# Patient Record
Sex: Female | Born: 1945 | Race: White | Hispanic: No | Smoking: Former smoker
Health system: Southern US, Community
[De-identification: ages and names within clinical notes are randomized; demographics above are authoritative.]

## PROBLEM LIST (undated history)

## (undated) DIAGNOSIS — S92909A Unspecified fracture of unspecified foot, initial encounter for closed fracture: Secondary | ICD-10-CM

## (undated) DIAGNOSIS — Z860100 Personal history of colon polyps, unspecified: Secondary | ICD-10-CM

## (undated) DIAGNOSIS — I619 Nontraumatic intracerebral hemorrhage, unspecified: Secondary | ICD-10-CM

## (undated) DIAGNOSIS — F329 Major depressive disorder, single episode, unspecified: Secondary | ICD-10-CM

## (undated) DIAGNOSIS — C801 Malignant (primary) neoplasm, unspecified: Secondary | ICD-10-CM

## (undated) DIAGNOSIS — M810 Age-related osteoporosis without current pathological fracture: Secondary | ICD-10-CM

## (undated) DIAGNOSIS — R569 Unspecified convulsions: Secondary | ICD-10-CM

## (undated) DIAGNOSIS — E785 Hyperlipidemia, unspecified: Secondary | ICD-10-CM

## (undated) DIAGNOSIS — R918 Other nonspecific abnormal finding of lung field: Secondary | ICD-10-CM

## (undated) DIAGNOSIS — G40802 Other epilepsy, not intractable, without status epilepticus: Principal | ICD-10-CM

## (undated) DIAGNOSIS — H9190 Unspecified hearing loss, unspecified ear: Secondary | ICD-10-CM

## (undated) DIAGNOSIS — F32A Depression, unspecified: Secondary | ICD-10-CM

## (undated) DIAGNOSIS — N951 Menopausal and female climacteric states: Secondary | ICD-10-CM

## (undated) DIAGNOSIS — Q2730 Arteriovenous malformation, site unspecified: Secondary | ICD-10-CM

## (undated) DIAGNOSIS — G4710 Hypersomnia, unspecified: Secondary | ICD-10-CM

## (undated) DIAGNOSIS — H5509 Other forms of nystagmus: Secondary | ICD-10-CM

## (undated) HISTORY — PX: EYE SURGERY: SHX253

## (undated) HISTORY — DX: Hypersomnia, unspecified: G47.10

## (undated) HISTORY — DX: Hyperlipidemia, unspecified: E78.5

## (undated) HISTORY — DX: Unspecified convulsions: R56.9

## (undated) HISTORY — DX: Other forms of nystagmus: H55.09

## (undated) HISTORY — PX: ABDOMINAL HYSTERECTOMY: SHX81

## (undated) HISTORY — DX: Arteriovenous malformation, site unspecified: Q27.30

## (undated) HISTORY — PX: OTHER SURGICAL HISTORY: SHX169

## (undated) HISTORY — DX: Other epilepsy, not intractable, without status epilepticus: G40.802

---

## 1973-11-29 DIAGNOSIS — Q2730 Arteriovenous malformation, site unspecified: Secondary | ICD-10-CM

## 1973-11-29 HISTORY — DX: Arteriovenous malformation, site unspecified: Q27.30

## 2002-03-31 HISTORY — PX: BREAST BIOPSY: SHX20

## 2004-04-17 ENCOUNTER — Ambulatory Visit: Payer: Self-pay | Admitting: Internal Medicine

## 2004-08-22 ENCOUNTER — Ambulatory Visit: Payer: Self-pay | Admitting: Internal Medicine

## 2005-02-26 ENCOUNTER — Emergency Department: Payer: Self-pay | Admitting: Emergency Medicine

## 2005-03-31 HISTORY — PX: SHOULDER SURGERY: SHX246

## 2005-04-01 ENCOUNTER — Ambulatory Visit: Payer: Self-pay | Admitting: Family Medicine

## 2005-04-24 ENCOUNTER — Ambulatory Visit: Payer: Self-pay | Admitting: Internal Medicine

## 2005-08-13 ENCOUNTER — Ambulatory Visit: Payer: Self-pay | Admitting: Internal Medicine

## 2005-10-14 ENCOUNTER — Ambulatory Visit: Payer: Self-pay | Admitting: Psychiatry

## 2006-04-28 ENCOUNTER — Ambulatory Visit: Payer: Self-pay | Admitting: Internal Medicine

## 2007-05-03 ENCOUNTER — Ambulatory Visit: Payer: Self-pay | Admitting: Internal Medicine

## 2008-05-03 ENCOUNTER — Ambulatory Visit: Payer: Self-pay | Admitting: Internal Medicine

## 2009-05-08 ENCOUNTER — Ambulatory Visit: Payer: Self-pay | Admitting: Internal Medicine

## 2009-12-17 ENCOUNTER — Ambulatory Visit (HOSPITAL_COMMUNITY): Admission: RE | Admit: 2009-12-17 | Discharge: 2009-12-17 | Payer: Self-pay | Admitting: Neurology

## 2010-05-13 ENCOUNTER — Ambulatory Visit: Payer: Self-pay | Admitting: Internal Medicine

## 2010-06-13 LAB — CBC
HCT: 48.8 % — ABNORMAL HIGH (ref 36.0–46.0)
Hemoglobin: 16.8 g/dL — ABNORMAL HIGH (ref 12.0–15.0)
MCH: 32.7 pg (ref 26.0–34.0)
MCHC: 34.4 g/dL (ref 30.0–36.0)
MCV: 95.1 fL (ref 78.0–100.0)
Platelets: 199 K/uL (ref 150–400)
RBC: 5.13 MIL/uL — ABNORMAL HIGH (ref 3.87–5.11)
RDW: 13.4 % (ref 11.5–15.5)
WBC: 8.8 K/uL (ref 4.0–10.5)

## 2010-06-13 LAB — BASIC METABOLIC PANEL WITH GFR
BUN: 11 mg/dL (ref 6–23)
CO2: 28 meq/L (ref 19–32)
Calcium: 9.2 mg/dL (ref 8.4–10.5)
Chloride: 108 meq/L (ref 96–112)
Creatinine, Ser: 0.63 mg/dL (ref 0.4–1.2)
GFR calc non Af Amer: >60 mL/min
Glucose, Bld: 92 mg/dL (ref 70–99)
Potassium: 3.9 meq/L (ref 3.5–5.1)
Sodium: 141 meq/L (ref 135–145)

## 2010-06-13 LAB — PROTIME-INR
INR: 0.88 (ref 0.00–1.49)
Prothrombin Time: 12.1 seconds (ref 11.6–15.2)

## 2010-11-30 ENCOUNTER — Ambulatory Visit: Payer: Self-pay | Admitting: Cardiothoracic Surgery

## 2010-12-09 ENCOUNTER — Ambulatory Visit: Payer: Self-pay | Admitting: Internal Medicine

## 2010-12-18 ENCOUNTER — Ambulatory Visit: Payer: Self-pay | Admitting: Specialist

## 2010-12-26 ENCOUNTER — Ambulatory Visit: Payer: Self-pay | Admitting: Cardiothoracic Surgery

## 2010-12-30 ENCOUNTER — Ambulatory Visit: Payer: Self-pay | Admitting: Cardiothoracic Surgery

## 2011-04-10 ENCOUNTER — Ambulatory Visit: Payer: Self-pay | Admitting: Cardiothoracic Surgery

## 2011-05-02 ENCOUNTER — Ambulatory Visit: Payer: Self-pay | Admitting: Cardiothoracic Surgery

## 2011-05-22 ENCOUNTER — Ambulatory Visit: Payer: Self-pay | Admitting: Internal Medicine

## 2011-08-12 ENCOUNTER — Ambulatory Visit: Payer: Self-pay | Admitting: Cardiothoracic Surgery

## 2011-08-14 ENCOUNTER — Ambulatory Visit: Payer: Self-pay | Admitting: Cardiothoracic Surgery

## 2011-08-30 ENCOUNTER — Ambulatory Visit: Payer: Self-pay | Admitting: Cardiothoracic Surgery

## 2012-02-12 ENCOUNTER — Ambulatory Visit: Payer: Self-pay | Admitting: Cardiothoracic Surgery

## 2012-02-29 ENCOUNTER — Ambulatory Visit: Payer: Self-pay | Admitting: Cardiothoracic Surgery

## 2012-03-04 ENCOUNTER — Ambulatory Visit: Payer: Self-pay | Admitting: Unknown Physician Specialty

## 2012-05-01 ENCOUNTER — Ambulatory Visit: Payer: Self-pay | Admitting: Cardiothoracic Surgery

## 2012-05-10 ENCOUNTER — Encounter: Payer: Self-pay | Admitting: *Deleted

## 2012-05-10 ENCOUNTER — Encounter: Payer: Self-pay | Admitting: Neurology

## 2012-05-10 DIAGNOSIS — G4730 Sleep apnea, unspecified: Secondary | ICD-10-CM | POA: Insufficient documentation

## 2012-05-10 DIAGNOSIS — H532 Diplopia: Secondary | ICD-10-CM

## 2012-05-10 DIAGNOSIS — Z79899 Other long term (current) drug therapy: Secondary | ICD-10-CM | POA: Insufficient documentation

## 2012-05-10 DIAGNOSIS — G4710 Hypersomnia, unspecified: Secondary | ICD-10-CM | POA: Insufficient documentation

## 2012-05-10 DIAGNOSIS — Q279 Congenital malformation of peripheral vascular system, unspecified: Secondary | ICD-10-CM | POA: Insufficient documentation

## 2012-05-10 DIAGNOSIS — H5509 Other forms of nystagmus: Secondary | ICD-10-CM

## 2012-05-11 ENCOUNTER — Encounter: Payer: Self-pay | Admitting: Neurology

## 2012-05-20 ENCOUNTER — Ambulatory Visit: Payer: Self-pay | Admitting: Cardiothoracic Surgery

## 2012-05-24 ENCOUNTER — Ambulatory Visit: Payer: Self-pay

## 2012-08-27 ENCOUNTER — Telehealth: Payer: Self-pay | Admitting: Neurology

## 2012-08-30 ENCOUNTER — Telehealth: Payer: Self-pay | Admitting: Neurology

## 2012-08-30 DIAGNOSIS — G40109 Localization-related (focal) (partial) symptomatic epilepsy and epileptic syndromes with simple partial seizures, not intractable, without status epilepticus: Secondary | ICD-10-CM

## 2012-08-30 MED ORDER — LAMOTRIGINE 100 MG PO TABS: 100.0000 mg | ORAL_TABLET | Freq: Two times a day (BID) | ORAL | Status: DC

## 2012-08-30 NOTE — Telephone Encounter (Signed)
Lamictal wants generic. Prescription changed.  She has a sensory seizure every  3 weeks or 4.

## 2012-08-30 NOTE — Telephone Encounter (Signed)
I called and spoke with the patient about the reason for her call. Patient stated she has questions for Dr. Vickey Huger concerning the brand name Lamictal compared to the generic (Lamotrigine).

## 2012-11-09 ENCOUNTER — Ambulatory Visit (INDEPENDENT_AMBULATORY_CARE_PROVIDER_SITE_OTHER): Payer: Medicare Other | Admitting: Neurology

## 2012-11-09 ENCOUNTER — Encounter: Payer: Self-pay | Admitting: Neurology

## 2012-11-09 ENCOUNTER — Other Ambulatory Visit: Payer: Self-pay

## 2012-11-09 VITALS — BP 124/69 | HR 75 | Resp 18 | Ht 64.5 in | Wt 123.0 lb

## 2012-11-09 DIAGNOSIS — G40802 Other epilepsy, not intractable, without status epilepticus: Secondary | ICD-10-CM

## 2012-11-09 HISTORY — DX: Other epilepsy, not intractable, without status epilepticus: G40.802

## 2012-11-09 MED ORDER — LAMOTRIGINE 100 MG PO TABS: 100.0000 mg | ORAL_TABLET | Freq: Two times a day (BID) | ORAL | Status: DC

## 2012-11-09 MED ORDER — PHENYTOIN SODIUM EXTENDED 100 MG PO CAPS: 100.0000 mg | ORAL_CAPSULE | ORAL | Status: DC

## 2012-11-09 MED ORDER — PHENYTOIN SODIUM EXTENDED 100 MG PO CAPS: ORAL_CAPSULE | ORAL | Status: DC

## 2012-11-09 MED ORDER — LAMOTRIGINE 100 MG PO TABS: ORAL_TABLET | ORAL | Status: DC

## 2012-11-09 NOTE — Addendum Note (Signed)
Addended by: Melvyn Novas on: 11/09/2012 03:22 PM   Modules accepted: Orders

## 2012-11-09 NOTE — Patient Instructions (Signed)
Seizures You had a seizure. About 2% of the population will have a seizure problem during their lifetime. Sometimes the cause for the seizure is not known. Seizures are usually associated with one of these problems:  Epilepsy.   Not taking your seizure medicine.   Alcohol and drug abuse.   Head injury, strokes, tumors, and brain surgery.   High fever and infections.   Low blood sugar.  Evaluating a new seizure disorder may require having a brain scan or a brain wave test called an EEG. If you have been given a seizure medicine, it is very important that you take it as prescribed. Not taking these medicines as directed is the most common cause of seizures. Blood tests are often used to be sure you are taking the proper dose.  Seizures cause many different symptoms, from convulsions to brief blackouts. Do not ride a bike, drive a car, go swimming, climb in high or dangerous places such as ladders or roofs, or operate any dangerous equipment until you have your doctor's permission. If you hold a driver's license, state law may require that a report be made to the motor vehicles department. You should wear an emergency medical identification bracelet with information about your seizures. If you have any warning that a seizure may occur, lie down in a safe place to protect yourself. Teach your family and friends what to do if you have any further seizures. They should stay calm and try to keep you from falling on hard or sharp objects. It is best not to try to restrain a seizing person or to force anything into his or her mouth. Do not try to open clenched jaws. When the seizure is over, the person should be rolled on their side to help drain any vomit or secretions from the mouth. After a seizure, a person may be confused or drowsy for several minutes. An ambulance should be called if the seizure lasted more than 5 minutes or if confusion remains for more than 30 minutes. Call your caregiver or the  emergency department for further instructions. Do not drive until cleared by your caregiver or neurologist! Document Released: 04/24/2004 Document Revised: 11/27/2010 Document Reviewed: 03/17/2005 [facility] Patient Information 2012 [place], [place].Arteriovenous Malformation An arteriovenous malformation (AVM) is a disorder that has been present since birth (congenital). It is characterized by a complex, tangled web of arteries and veins. An AVM may occur in the brain, brainstem, or spinal cord. CAUSES  It is caused by abnormal development of blood vessels. SYMPTOMS  The most common problems (symptoms) of AVM include:  Bleeding (hemorrhaging).  Convulsions (seizures).  Headaches.  Neurological problems, such as:  Paralysis.  Loss of speech, memory, or vision. TREATMENT  There are three general forms of treatment for AVM:  Surgery.  Embolization. This involves closing off the vessels of the AVM by injecting glue into them. Embolization is often used before surgery.  Radiosurgery. This involves focusing radiation on the AVM. AVMs that hemorrhage can lead to serious neurological problems, and sometimes death. But some people have AVMs that never cause problems. Document Released: 03/07/2002 Document Revised: 06/09/2011 Document Reviewed: 10/21/2007 [facility] Patient Information 2014 [place], [place].

## 2012-11-09 NOTE — Progress Notes (Signed)
[facility]  Provider:  Melvyn Novas, M D  Referring Provider: Melvyn Novas, MD Primary Care Physician:  Clydie Braun, MD  Chief Complaint  Patient presents with  . Follow-up    seizures,rm10     HPI:  Sally Green is a 67 y.o. female  Is seen here as a referral/ revisit  from Dr. Sampson Goon, followed Dr. Candelaria Stagers into practice.   The patient was diagnosed with an AV and in 2008 by Dr. Tresa Endo , Dr Candelaria Stagers in [place]. She was continuously following for her seizures, presumed to be caused by the AVM. She has no all followed here at [facility] and paralleled by Dr. Zachery Conch at [facility]. The patient's spells are described as sensory spells the typical symptom of the tingling in the fingertips or even a whole course of sometimes the duration is less than a minute sometimes a bottle minutes there has never been a visible consult and she has never suffered any injuries related to one of these spells,  she does not lose awareness or consciousness. We performed a  Full EEG -polysomnography in August 2012. There was no seizure activity seen in sleep and the patient was neither excessively daytime sleepy nor did she have any apnea.  There was also no hypoxia noted.   In August 20 13 she presented again with sensory spells the tingling of the fingertips and in dorsal. At that time she averaged 2 -3 brief spells per month. Her Dilantin level was therapeutic.  In January 2014, I increased the Lamictal doses in response to ongoing 2-3 seizures a month , paralleled to have decreased to one seizure a month for a brief period of time.  She takes Lamotrigine brand name lamictal - 100 mg tablets 2 in the morning and 3 in the evening ( brand-name only but she could not longer afford this and switch to a generic). She also takes Dilantin brand-name 300 mg at night. She has always kept a seizure diary and she brings on today.  On 05-14-12 her  electrolytes were all  In normal limits,  her Dilantin level was 7.3 , and her GFR was normal. She is scheduled to  Have repaeat labs  Next week before she sees Dr Zachery Conch.   She have to spells in March one in April, 3 [place], 3 in June, 2 in July and one sulfite in August all of the duration of 12 minutes or less. She reports that she has trouble speaking and finding words during this period , having  speech arrest. Review of Systems: Out of a complete 14 system review, the patient complains of only the following symptoms, and all other reviewed systems are negative.   History   Social History  . Marital Status: Married    Spouse Name: [name]    Number of Children: 3  . Years of Education: 12   Occupational History  . retired    Social History Main Topics  . Smoking status: Current Every Day Smoker -- 10.00 packs/day    Types: Cigarettes  . Smokeless tobacco: Never Used  . Alcohol Use: No  . Drug Use: No  . Sexually Active: Not on file   Other Topics Concern  . Not on file   Social History Narrative   Consumes caffeine rarely.    Family History  Problem Relation Age of Onset  . Lymphoma Mother   . Alzheimer's disease Father   . Aneurysm Father   . Headache Father   .  Headache Sister   . Cancer Sister     lung  . Cancer Other   . Aneurysm Paternal Uncle   . Cancer Maternal Aunt   . Cancer Paternal Aunt   . Leukemia Maternal Uncle     Past Medical History  Diagnosis Date  . Hypersomnia   . 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 Hyperlipemia   . Seizures   . 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  Sees dr Zachery Conch at [facility]  Oct 2014.      Past Surgical History  Procedure Laterality Date  . Shoulder surgery Left 2007  . Vaginal delivery      3 children    Current Outpatient Prescriptions   Medication Sig Dispense Refill  . atorvastatin (LIPITOR) 10 MG tablet Take 10 mg by mouth daily.      Marland Kitchen lamoTRIgine (LAMICTAL) 100 MG tablet Take 100 mg by mouth 2 (two) times daily. 2 tablets in am,3 tablets late afternoon      . phenytoin (DILANTIN) 100 MG ER capsule Take 100 mg by mouth 3 (three) times daily.       No current facility-administered medications for this visit.    Allergies as of 11/09/2012 - Review Complete 11/09/2012  Allergen Reaction Noted  . Depakote (divalproex sodium)  05/10/2012  . Keppra (levetiracetam)  05/10/2012  . Topamax (topiramate)  05/10/2012    Vitals: BP 124/69  Pulse 75  Resp 18  Ht 5' 4.5" (1.638 m)  Wt 123 lb (55.792 kg)  BMI 20.79 kg/m2 Last Weight:  Wt Readings from Last 1 Encounters:  11/09/12 123 lb (55.792 kg)   Last Height:   Ht Readings from Last 1 Encounters:  11/09/12 5' 4.5" (1.638 m)    Physical exam:  General: The patient is awake, alert and appears not in acute distress. The patient is well groomed. Head: Normocephalic, atraumatic. Neck is supple. Mallampati 2, neck circumference: 13,  Cardiovascular:  Regular rate and rhythm, without  murmurs or carotid bruit, and without distended neck veins. Respiratory: Lungs wheezing . Skin:  Without evidence of edema, or rash Trunk: BMI is normal , slender- normal  posture.  Neurologic exam : The patient is awake and alert, oriented to place and time.  Memory subjective described as intact. There is a normal attention span & concentration ability. Speech is fluent without dysarthria, dysphonia or aphasia.  Mood and affect are tearful, depressed ( her sister  has been diagnosed with cancer of the lung) .  Cranial nerves: Pupils are equal and briskly reactive to light. Funduscopic exam without  evidence of pallor or edema. Extraocular movements  in vertical and horizontal planes with  endpoint  Nystagmus, stronger  with gaze to the right. .  Visual fields by finger perimetry are  intact. Hearing to finger rub intact.  Facial sensation intact to fine touch. Facial motor strength is symmetric and tongue and uvula move midline.  Motor exam:  The patient has a slightly weaker grip in her right hand than in the left, but normal muscle bulk and tone tremor. No rigor. Neurosensory is intact to pinprick and vibration in fingertips and toes. The  Coordination rapid alternating movements are normal. Finger-to-nose performed accurately bilaterally she has a modest tremor with action but not at rest.  Gait and station of items from the chair without assistance walks without assistance,  stance is normal and not wide-based. Romberg was negative  Reflexes were all symmetric at 1+,  her toes are downgoing.   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   the patient has been stable now for all the 3 years on the current medication I will order Q6 months blood levels she could have gone through her primary care physician, Dr. Sampson Goon. I would also like a comprehensive metabolic panel and a CBC with differential. Since she is already having an appointment for labs drawn I will ask Dr. Sampson Goon kindly to provide these labs for me instead of having labs drawn at 2 different locations within a short time.

## 2012-11-23 ENCOUNTER — Other Ambulatory Visit: Payer: Self-pay | Admitting: Neurology

## 2012-11-30 ENCOUNTER — Other Ambulatory Visit: Payer: Self-pay | Admitting: Neurology

## 2012-12-15 ENCOUNTER — Emergency Department: Payer: Self-pay | Admitting: Emergency Medicine

## 2013-01-25 ENCOUNTER — Ambulatory Visit: Payer: Self-pay

## 2013-02-26 ENCOUNTER — Other Ambulatory Visit: Payer: Self-pay | Admitting: Neurology

## 2013-02-28 ENCOUNTER — Telehealth: Payer: Self-pay | Admitting: Neurology

## 2013-02-28 NOTE — Telephone Encounter (Signed)
We have never prescribed Atorvistatin before.  This is typically prescribed by a PCP.  I called the patient, she said she did not think we prescribed this med and will call her PCP for refills.

## 2013-03-22 ENCOUNTER — Telehealth: Payer: Self-pay | Admitting: Neurology

## 2013-03-22 NOTE — Telephone Encounter (Signed)
Called patient to reschedule 06/09/13 appointment per Dr. Oliva Bustard schedule. First available was 10/05/13, patient states that it is too long and she cannot wait that long. Please call the patient.

## 2013-03-22 NOTE — Telephone Encounter (Signed)
Left message for patient to call and reschedule 06/09/13 appointment per Dr. Dohmeier's schedule.  °

## 2013-05-25 ENCOUNTER — Ambulatory Visit: Payer: Self-pay

## 2013-06-03 ENCOUNTER — Ambulatory Visit: Payer: Self-pay | Admitting: Cardiothoracic Surgery

## 2013-06-06 ENCOUNTER — Ambulatory Visit: Payer: Self-pay

## 2013-06-08 ENCOUNTER — Ambulatory Visit: Payer: Self-pay | Admitting: Cardiothoracic Surgery

## 2013-06-09 ENCOUNTER — Ambulatory Visit: Payer: Medicare Other | Admitting: Neurology

## 2013-06-09 LAB — CBC CANCER CENTER
BASOS ABS: 0.1 x10 3/mm (ref 0.0–0.1)
Basophil %: 1 %
EOS PCT: 1.3 %
Eosinophil #: 0.1 x10 3/mm (ref 0.0–0.7)
HCT: 44.5 % (ref 35.0–47.0)
HGB: 14.8 g/dL (ref 12.0–16.0)
LYMPHS PCT: 43.7 %
Lymphocyte #: 2.5 x10 3/mm (ref 1.0–3.6)
MCH: 31.3 pg (ref 26.0–34.0)
MCHC: 33.3 g/dL (ref 32.0–36.0)
MCV: 94 fL (ref 80–100)
MONO ABS: 0.8 x10 3/mm (ref 0.2–0.9)
MONOS PCT: 14.3 %
Neutrophil #: 2.3 x10 3/mm (ref 1.4–6.5)
Neutrophil %: 39.7 %
PLATELETS: 189 x10 3/mm (ref 150–440)
RBC: 4.72 10*6/uL (ref 3.80–5.20)
RDW: 14.2 % (ref 11.5–14.5)
WBC: 5.7 x10 3/mm (ref 3.6–11.0)

## 2013-06-09 LAB — COMPREHENSIVE METABOLIC PANEL
ANION GAP: 7 (ref 7–16)
AST: 17 U/L (ref 15–37)
Albumin: 3.3 g/dL — ABNORMAL LOW (ref 3.4–5.0)
Alkaline Phosphatase: 109 U/L
BUN: 12 mg/dL (ref 7–18)
Bilirubin,Total: 0.3 mg/dL (ref 0.2–1.0)
CALCIUM: 8.8 mg/dL (ref 8.5–10.1)
CO2: 32 mmol/L (ref 21–32)
CREATININE: 0.72 mg/dL (ref 0.60–1.30)
Chloride: 104 mmol/L (ref 98–107)
EGFR (African American): >60
EGFR (Non-African Amer.): >60
Glucose: 72 mg/dL (ref 65–99)
Osmolality: 283 (ref 275–301)
POTASSIUM: 3.5 mmol/L (ref 3.5–5.1)
SGPT (ALT): 16 U/L (ref 12–78)
Sodium: 143 mmol/L (ref 136–145)
Total Protein: 6.8 g/dL (ref 6.4–8.2)

## 2013-06-09 LAB — PROTIME-INR
INR: 0.9
PROTHROMBIN TIME: 11.9 s (ref 11.5–14.7)

## 2013-06-09 LAB — APTT: ACTIVATED PTT: 24.5 s (ref 23.6–35.9)

## 2013-06-16 ENCOUNTER — Ambulatory Visit: Payer: Self-pay | Admitting: Cardiothoracic Surgery

## 2013-06-21 ENCOUNTER — Ambulatory Visit: Payer: Self-pay | Admitting: Physical Medicine and Rehabilitation

## 2013-06-29 ENCOUNTER — Ambulatory Visit: Payer: Self-pay | Admitting: Cardiothoracic Surgery

## 2013-07-21 ENCOUNTER — Other Ambulatory Visit: Payer: Self-pay | Admitting: Neurology

## 2013-07-22 ENCOUNTER — Encounter (INDEPENDENT_AMBULATORY_CARE_PROVIDER_SITE_OTHER): Payer: Self-pay

## 2013-07-22 ENCOUNTER — Encounter: Payer: Self-pay | Admitting: Neurology

## 2013-07-22 ENCOUNTER — Ambulatory Visit (INDEPENDENT_AMBULATORY_CARE_PROVIDER_SITE_OTHER): Payer: Medicare Other | Admitting: Neurology

## 2013-07-22 VITALS — BP 144/64 | HR 70 | Resp 18 | Ht 65.0 in | Wt 125.0 lb

## 2013-07-22 DIAGNOSIS — G40802 Other epilepsy, not intractable, without status epilepticus: Secondary | ICD-10-CM

## 2013-07-22 DIAGNOSIS — Q282 Arteriovenous malformation of cerebral vessels: Secondary | ICD-10-CM | POA: Insufficient documentation

## 2013-07-22 DIAGNOSIS — J984 Other disorders of lung: Secondary | ICD-10-CM

## 2013-07-22 DIAGNOSIS — Q283 Other malformations of cerebral vessels: Secondary | ICD-10-CM

## 2013-07-22 MED ORDER — LAMOTRIGINE 100 MG PO TABS: ORAL_TABLET | ORAL | Status: DC

## 2013-07-22 MED ORDER — DILANTIN 100 MG PO CAPS: 100.0000 mg | ORAL_CAPSULE | Freq: Every day | ORAL | Status: DC

## 2013-07-22 NOTE — Progress Notes (Signed)
[facility]  Provider:  Larey Seat, M D  Referring Provider: Adrian Prows, MD Primary Care Physician:  Adrian Prows, MD  Chief Complaint  Patient presents with  . Follow-up    Room 10  . Seizures    HPI:  Sally Green is a 68 y.o. female  Is seen here as a referral/ revisit  from Dr. Ola Spurr, followed Dr. Mable Fill into practice.   The patient was diagnosed with an AV and in 2008 by Dr. Claiborne Billings , Dr Mable Fill in [place]. She was continuously following for her seizures, presumed to be caused by the AVM. She has no all followed here at [facility] and paralleled by Dr. Tommi Rumps at [facility]. The patient's spells are described as sensory spells the typical symptom of the tingling in the fingertips or even a whole course of sometimes the duration is less than a minute sometimes a bottle minutes there has never been a visible consult and she has never suffered any injuries related to one of these spells,  she does not lose awareness or consciousness. We performed a  Full EEG -polysomnography in August 2012. There was no seizure activity seen in sleep and the patient was neither excessively daytime sleepy nor did she have any apnea.  There was also no hypoxia noted.   In August 20 13 she presented again with sensory spells the tingling of the fingertips and in dorsal. At that time she averaged 2 -3 brief spells per month. Her Dilantin level was therapeutic.  In January 2014, I increased the Lamictal doses in response to ongoing 2-3 seizures a month , paralleled to have decreased to one seizure a month for a brief period of time.  She takes Lamotrigine brand name lamictal - 100 mg tablets 2 in the morning and 3 in the evening ( brand-name only but she could not longer afford this and switch to a generic). She also takes Dilantin brand-name 300 mg at night. She has always kept a seizure diary and she brings on today.  On  05-14-12 her electrolytes were all  In normal limits,  her Dilantin level was 7.3 , and her GFR was normal.  She is scheduled to have repaeat labs  With PCP,  She sees Dr Tommi Rumps at [facility]. .   She have to spells in March one in April, 3 May, 3 in June, 2 in July and one in August 2014 all of the duration of 12 minutes or less. She reports that she has trouble speaking and finding words during this period , having  speech arrest.  She is less stressed about her sister, and has had less spells in 2015, 1-1.5 minutes of sensory abnormalities, 1 per month.     Review of Systems: Out of a complete 14 system review, the patient complains of only the following symptoms, and all other reviewed systems are negative.  Sensory spells.  SOB  With minimal exertion.   History   Social History  . Marital Status: Married    Spouse Name: [name]    Number of Children: 3  . Years of Education: 12   Occupational History  . retired    Social History Main Topics  . Smoking status: Former Smoker -- 10.00 packs/day    Types: Cigarettes    Quit date: 06/24/2013  . Smokeless tobacco: Never Used  . Alcohol Use: No  . Drug Use: No  . Sexual Activity: Not on file   Other Topics Concern  .  Not on file   Social History Narrative   Patient is married ([name]) and lives at home with her husband.   Patient has three children.   Patient is retired.   Patient has a high school education.   Patient is right-handed.   Consumes caffeine rarely.    Family History  Problem Relation Age of Onset  . Lymphoma Mother   . Alzheimer's disease Father   . Aneurysm Father   . Headache Father   . Headache Sister   . Cancer Sister     lung  . Cancer Other   . Aneurysm Paternal Uncle   . Cancer Maternal Aunt   . Cancer Paternal Aunt   . Leukemia Maternal Uncle     Past Medical History  Diagnosis Date  . Hypersomnia   . 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 AVM (arteriovenous malformation)  11/1973  . Hyperlipemia   . Seizures   . 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  Sees dr Tommi Rumps at [facility]  Oct 2014.      Past Surgical History  Procedure Laterality Date  . Shoulder surgery Left 2007  . Vaginal delivery      3 children    Current Outpatient Prescriptions  Medication Sig Dispense Refill  . atorvastatin (LIPITOR) 10 MG tablet Take 10 mg by mouth daily.      Marland Kitchen DILANTIN 100 MG ER capsule take 3 capsules by mouth at bedtime  270 capsule  0  . lamoTRIgine (LAMICTAL) 100 MG tablet 2 tablets in am,3 tablets late afternoon  450 tablet  3   No current facility-administered medications for this visit.    Allergies as of 07/22/2013 - Review Complete 07/22/2013  Allergen Reaction Noted  . Depakote [divalproex sodium]  05/10/2012  . Keppra [levetiracetam]  05/10/2012  . Topamax [topiramate]  05/10/2012    Vitals: BP 144/64  Pulse 70  Resp 18  Ht 5\' 5"  (1.651 m)  Wt 125 lb (56.7 kg)  BMI 20.80 kg/m2 Last Weight:  Wt Readings from Last 1 Encounters:  07/22/13 125 lb (56.7 kg)   Last Height:   Ht Readings from Last 1 Encounters:  07/22/13 5\' 5"  (1.651 m)    Physical exam:  THE PATIENT PROUDLY PRESENT AFTER 3 MONTH OF NOT SMOKING  General: The patient is awake, alert and appears not in acute distress. The patient is well groomed. Head: Normocephalic.  Neck is supple. Mallampati 2, neck circumference: 13,  Cardiovascular:  Regular rate and rhythm, without  murmurs or carotid bruit, and without distended neck veins. Respiratory: Lungs wheezing . Skin:  Without evidence of edema, or rash.  Advanced skin aging.  Trunk: BMI is normal , slender- normal  posture.  Neurologic exam : The patient is awake and alert, oriented to place and time.  Memory subjective described as intact.  There is a normal attention span & concentration ability. Speech is fluent  without dysarthria, dysphonia or aphasia.  Mood and affect are optimistic, after being diagnosed with a nodule of the left lung ( her sister has been diagnosed with cancer of the lung- is still in chemotherapy) .  Cranial nerves: Pupils are equal and briskly reactive to light.  Funduscopic exam without  evidence of pallor or edema. Extraocular movements  in vertical and horizontal planes with "Endpoint -Nystagmus", stronger with gaze to the right.  Visual fields by finger perimetry are  intact. Hearing to finger rub intact. Facial sensation intact to fine touch. Facial motor strength is symmetric and tongue and uvula move midline.  Motor exam:  The patient has a slightly weaker grip in her right hand than in the left, but normal muscle bulk and tone, no tremor. No rigor.  Neurosensory is intact to pinprick and vibration in fingertips and toes.  The Coordination of rapid alternating movements is normal.  Finger-to-nose performed accurately bilaterally, she has still a modest tremor with action but not at rest.  Gait and station; arises  from the chair without assistance , walks without assistance, stance is normal and not wide-based.  Romberg was negative.  Reflexes were all symmetric at 1+,  her toes are downgoing.   Assessment : inoperable AVM of the left brain,  Diagnosed 1975.  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 No convulsions.  the patient has been stable now for all the 3.5 years on the current medication .

## 2013-07-22 NOTE — Addendum Note (Signed)
Addended by: Larey Seat on: 07/22/2013 10:27 AM   Modules accepted: Orders

## 2013-07-22 NOTE — Patient Instructions (Signed)
Arteriovenous Malformation An arteriovenous malformation (AVM) is a disorder that has been present since birth (congenital). It is characterized by a complex, tangled web of arteries and veins. An AVM may occur in the brain, brainstem, or spinal cord. CAUSES  It is caused by abnormal development of blood vessels. SYMPTOMS  The most common problems (symptoms) of AVM include:  Bleeding (hemorrhaging).  Convulsions (seizures).  Headaches.  Neurological problems, such as:  Paralysis.  Loss of speech, memory, or vision. TREATMENT  There are three general forms of treatment for AVM:  Surgery.  Embolization. This involves closing off the vessels of the AVM by injecting glue into them. Embolization is often used before surgery.  Radiosurgery. This involves focusing radiation on the AVM. AVMs that hemorrhage can lead to serious neurological problems, and sometimes death. But some people have AVMs that never cause problems. Document Released: 03/07/2002 Document Revised: 06/09/2011 Document Reviewed: 10/21/2007 [facility] Patient Information 2014 [place].

## 2013-08-01 ENCOUNTER — Ambulatory Visit (INDEPENDENT_AMBULATORY_CARE_PROVIDER_SITE_OTHER): Payer: Medicare Other | Admitting: Pulmonary Disease

## 2013-08-01 ENCOUNTER — Encounter: Payer: Self-pay | Admitting: Pulmonary Disease

## 2013-08-01 VITALS — BP 136/70 | HR 82 | Ht 64.0 in | Wt 126.0 lb

## 2013-08-01 DIAGNOSIS — R911 Solitary pulmonary nodule: Secondary | ICD-10-CM | POA: Insufficient documentation

## 2013-08-01 DIAGNOSIS — J449 Chronic obstructive pulmonary disease, unspecified: Secondary | ICD-10-CM

## 2013-08-01 NOTE — Progress Notes (Signed)
Subjective:    Patient ID: Sally Green, female    DOB: 09/16/1945, 68 y.o.   MRN: [id number]  HPI  This is a very pleasant 68 year old female with COPD who is referred to my clinic by Dr. Nestor Lewandowsky with thoracic surgery here in town. She smoked cigarettes for many many years but quit 7 weeks ago. Around this time she was seeing Dr. Genevive Bi for a pulmonary nodule which is been followed for several years. The most recent PET CT showed no increased uptake but a slight increase in the size of the nodule. 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 She and Dr. Faith Rogue decided to continue to follow it with a CT scan because biopsy would be difficult given her very low FEV1.  Apparently she was found to have an FEV1 of 880 cc.  She says that since she quit smoking 7 weeks ago her breathing has improved significantly. She is no longer coughing. She is now walking 1 mile a day with her sister and feels a little short of breath when doing this but is able to complete a mile without stopping. She does not have shortness of breath when doing regular everyday activities such as climbing stairs, carrying groceries, and running a vacuum cleaner. She does not have chest tightness. She says that her life is "100% better" since stopping smoking. She's currently not taking any inhaled therapies.  She has had recurrent episodes of bronchitis over the years. She has never had to be hospitalized for this. The most recent one was approximately 1.5 years ago.   Past Medical History  Diagnosis Date  . Hypersomnia   . 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 Hyperlipemia   . Seizures   . 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  Sees dr Tommi Rumps at [facility]  Oct 2014.       Family History  Problem Relation Age of Onset  . Lymphoma Mother   .  Alzheimer's disease Father   . Aneurysm Father   . Headache Father   . Headache Sister   . Cancer Sister     lung  . Cancer Other   . Aneurysm Paternal Uncle   . Cancer Maternal Aunt   . Cancer Paternal Aunt   . Leukemia Maternal Uncle      History   Social History  . Marital Status: Married    Spouse Name: [name]    Number of Children: 3  . Years of Education: 12   Occupational History  . retired    Social History Main Topics  . Smoking status: Former Smoker -- 1.25 packs/day for 50 years    Types: Cigarettes    Quit date: 06/24/2013  . Smokeless tobacco: Never Used  . Alcohol Use: No  . Drug Use: No  . Sexual Activity: Not on file   Other Topics Concern  . Not on file   Social History Narrative   Patient is married ([name]) and lives at home with her husband.   Patient has three children.   Patient is retired.   Patient has a high school education.   Patient is right-handed.   Consumes caffeine rarely.     Allergies  Allergen Reactions  . Depakote [Divalproex Sodium]   . Keppra [Levetiracetam]   . Topamax [Topiramate]      Outpatient Prescriptions Prior to  Visit  Medication Sig Dispense Refill  . atorvastatin (LIPITOR) 10 MG tablet Take 10 mg by mouth daily.      Marland Kitchen lamoTRIgine (LAMICTAL) 100 MG tablet 2 tablets in am,3 tablets late afternoon  450 tablet  3  . DILANTIN 100 MG ER capsule Take 1 capsule (100 mg total) by mouth at bedtime.  270 capsule  3   No facility-administered medications prior to visit.      Review of Systems  Constitutional: Negative for fever and unexpected weight change.  HENT: Negative for congestion, dental problem, ear pain, nosebleeds, postnasal drip, rhinorrhea, sinus pressure, sneezing, sore throat and trouble swallowing.   Eyes: Negative for redness and itching.  Respiratory: Negative for cough, chest tightness, shortness of breath and wheezing.   Cardiovascular: Negative for palpitations and leg swelling.   Gastrointestinal: Negative for nausea and vomiting.  Genitourinary: Negative for dysuria.  Musculoskeletal: Negative for joint swelling.  Skin: Negative for rash.  Neurological: Negative for headaches.  Hematological: Does not bruise/bleed easily.  Psychiatric/Behavioral: Negative for dysphoric mood. The patient is not nervous/anxious.        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 Faith Rogue office several weeks ago. Since then she has quit smoking and has now completely abstain from cigarettes for 7 months. She says that the difference in her breathing is absolutely amazing. She is now walking a mile a day without too much difficulty. She is actually going to increase this to 2 miles a day this week. She is not feeling limited by shortness of breath when she does things like carrying groceries, climb flights of stairs, or running the vacuum cleaner.  She's currently not taking any inhaled therapy. Because of her history of what sounds like recurrent exacerbations and the severity of her airflow obstruction I have encouraged her to try Spiriva. However, I think the biggest benefit that she has received so far has been by quitting smoking. It's not clear  to me that she will get much symptomatic benefit from the Spiriva.  Plan: -Obtain records of the full pulmonary function test -Trial of Spiriva -Remain active -Get a flu shot in the fall -Call pharmacy and let me know which pneumonia vaccine she received 2 years ago so we can update her in this regard -Followup 6 months  Solitary pulmonary nodule She and Dr. Genevive Bi had an extensive conversation regarding this nodule. Currently they're planning to watch it with every 6 month CT scans. I feel that this nodule is benign and I agree with their current strategy.    Updated Medication List Outpatient Encounter Prescriptions as of 08/01/2013  Medication Sig  . acetaminophen (TYLENOL) 325 MG tablet Take 650 mg by  mouth every 6 (six) hours as needed.  Marland Kitchen atorvastatin (LIPITOR) 10 MG tablet Take 10 mg by mouth daily.  Marland Kitchen lamoTRIgine (LAMICTAL) 100 MG tablet 2 tablets in am,3 tablets late afternoon  . phenytoin (DILANTIN) 100 MG ER capsule Take 300 mg by mouth at bedtime.  . [DISCONTINUED] DILANTIN 100 MG ER capsule Take 1 capsule (100 mg total) by mouth at bedtime.

## 2013-08-01 NOTE — Assessment & Plan Note (Signed)
She and Dr. Genevive Bi had an extensive conversation regarding this nodule. Currently they're planning to watch it with every 6 month CT scans. I feel that this nodule is benign and I agree with their current strategy.

## 2013-08-01 NOTE — Patient Instructions (Signed)
Try using the Spiriva in the morning once a day and call us after you complete the sample to let us know how you feel Stay away from cigarettes! Keep exercising regularly Let us know what kind of pneumonia shot you had We will see you back in 6 months or sooner if needed

## 2013-08-01 NOTE — Assessment & Plan Note (Signed)
Joanmarie had severe airflow obstruction on pulmonary function testing performed by Dr. Faith Rogue office several weeks ago. Since then she has quit smoking and has now completely abstain from cigarettes for 7 months. She says that the difference in her breathing is absolutely amazing. She is now walking a mile a day without too much difficulty. She is actually going to increase this to 2 miles a day this week. She is not feeling limited by shortness of breath when she does things like carrying groceries, climb flights of stairs, or running the vacuum cleaner.  She's currently not taking any inhaled therapy. Because of her history of what sounds like recurrent exacerbations and the severity of her airflow obstruction I have encouraged her to try Spiriva. However, I think the biggest benefit that she has received so far has been by quitting smoking. It's not clear to me that she will get much symptomatic benefit from the Spiriva.  Plan: -Obtain records of the full pulmonary function test -Trial of Spiriva -Remain active -Get a flu shot in the fall -Call pharmacy and let me know which pneumonia vaccine she received 2 years ago so we can update her in this regard -Followup 6 months

## 2013-08-02 ENCOUNTER — Telehealth: Payer: Self-pay | Admitting: Pulmonary Disease

## 2013-08-02 NOTE — Telephone Encounter (Signed)
This has been added to the pt's chart. Will not call the pt back, nothing further is needed.

## 2013-08-08 ENCOUNTER — Encounter: Payer: Self-pay | Admitting: Pulmonary Disease

## 2013-12-07 ENCOUNTER — Ambulatory Visit: Payer: Self-pay

## 2013-12-22 ENCOUNTER — Ambulatory Visit: Payer: Self-pay | Admitting: Cardiothoracic Surgery

## 2013-12-29 ENCOUNTER — Ambulatory Visit: Payer: Self-pay | Admitting: Cardiothoracic Surgery

## 2014-01-24 ENCOUNTER — Ambulatory Visit: Payer: Medicare Other | Admitting: Adult Health

## 2014-02-16 ENCOUNTER — Encounter: Payer: Self-pay | Admitting: Adult Health

## 2014-02-16 ENCOUNTER — Ambulatory Visit (INDEPENDENT_AMBULATORY_CARE_PROVIDER_SITE_OTHER): Payer: Medicare Other | Admitting: Adult Health

## 2014-02-16 VITALS — BP 128/62 | HR 74 | Ht 64.0 in | Wt 119.0 lb

## 2014-02-16 DIAGNOSIS — Z5181 Encounter for therapeutic drug level monitoring: Secondary | ICD-10-CM

## 2014-02-16 DIAGNOSIS — R569 Unspecified convulsions: Secondary | ICD-10-CM

## 2014-02-16 NOTE — Patient Instructions (Signed)
Seizure, Adult A seizure means there is unusual activity in the brain. A seizure can cause changes in attention or behavior. Seizures often cause shaking (convulsions). Seizures often last from 30 seconds to 2 minutes. HOME CARE   If you are given medicines, take them exactly as told by your doctor.  Keep all doctor visits as told.  Do not swim or drive until your doctor says it is okay.  Teach others what to do if you have a seizure. They should:  Lay you on the ground.  Put a cushion under your head.  Loosen any tight clothing around your neck.  Turn you on your side.  Stay with you until you get better. GET HELP RIGHT AWAY IF:   The seizure lasts longer than 2 to 5 minutes.  The seizure is very bad.  The person does not wake up after the seizure.  The person's attention or behavior changes. Drive the person to the emergency room or call your local emergency services (911 in [place] MAKE SURE YOU:   Understand these instructions.  Will watch your condition.  Will get help right away if you are not doing well or get worse. Document Released: 09/03/2007 Document Revised: 06/09/2011 Document Reviewed: 03/05/2011 Exit[employer] Patient Information 2015 [employer]. This information is not intended to replace advice given to you by your health care provider. Make sure you discuss any questions you have with your health care provider.  

## 2014-02-16 NOTE — Progress Notes (Signed)
PATIENT: Sally Green DOB: Apr 17, 1945  REASON FOR VISIT: follow up HISTORY FROM: patient  HISTORY OF PRESENT ILLNESS: Sally Green is a 68 year old female with a history of AVM and seizures. She returns today for follow-up. She is currently taking Dilantin 300 mg at bedtime and lamictal 100 mg . Her last seizure was on Nov. 9th. She has several seizures since her last visit. She states that her seizures consist of a tingling sensation down the body.  On average her seizures will last 1-1.5 minutes. She states that in the last few years the longest she has gone without a seizure is 3 weeks. She operates a Teacher, music without difficulty. She is able to complete all ADLs independently. No changes in her gait or balance. The patient does report that her husband passed away 4 weeks ago. She states that she is having a hard time sleeping. She states that she wakes up several times a night.   HISTORY 11/09/12 [facility]): The patient was diagnosed with an AV and in 2008 by Sally Green , Sally Green in [place]. She was continuously following for her seizures, presumed to be caused by the AVM. She has no all followed here at [facility] and paralleled by Sally Green at [facility]. The patient's spells are described as sensory spells the typical symptom of the tingling in the fingertips or even a whole course of sometimes the duration is less than a minute sometimes a bottle minutes there has never been a visible consult and she has never suffered any injuries related to one of these spells,  she does not lose awareness or consciousness. We performed a  Full EEG -polysomnography in August 2012. There was no seizure activity seen in sleep and the patient was neither excessively daytime sleepy nor did she have any apnea.  There was also no hypoxia noted. In August 20 13 she presented again with sensory spells the tingling of the fingertips and in dorsal. At that time she  averaged 2 -3 brief spells per month. Her Dilantin level was therapeutic.  In January 2014, I increased the Lamictal doses in response to ongoing 2-3 seizures a month , paralleled to have decreased to one seizure a month for a brief period of time.  She takes Lamotrigine brand name lamictal - 100 mg tablets 2 in the morning and 3 in the evening ( brand-name only but she could not longer afford this and switch to a generic). She also takes Dilantin brand-name 300 mg at night. She has always kept a seizure diary and she brings on today.On 05-14-12 her electrolytes were all  In normal limits,  her Dilantin level was 7.3 , and her GFR was normal. She is scheduled to  Have repaeat labs  Next week before she sees Sally Tommi Green. She have to spells in March one in April, 3 [place], 3 in June, 2 in July and one sulfite in August all of the duration of 12 minutes or less. She reports that she has trouble speaking and finding words during this period , having  speech arrest    REVIEW OF SYSTEMS: Out of a complete 14 system review of symptoms, the patient complains only of the following symptoms, and all other reviewed systems are negative.  insomnia  ALLERGIES: Allergies  Allergen Reactions  . Depakote [Divalproex Sodium]   . Keppra [Levetiracetam]   . Topamax [Topiramate]   . Guaifenesin Palpitations    HOME MEDICATIONS: Outpatient Prescriptions  Prior to Visit  Medication Sig Dispense Refill  . acetaminophen (TYLENOL) 325 MG tablet Take 650 mg by mouth every 6 (six) hours as needed.    Marland Kitchen atorvastatin (LIPITOR) 10 MG tablet Take 10 mg by mouth daily.    Marland Kitchen lamoTRIgine (LAMICTAL) 100 MG tablet 2 tablets in am,3 tablets late afternoon 450 tablet 3  . phenytoin (DILANTIN) 100 MG ER capsule Take 300 mg by mouth at bedtime.     No facility-administered medications prior to visit.    PAST MEDICAL HISTORY: Past Medical History  Diagnosis Date  . Hypersomnia   . Nystagmus with deficiency of saccadic eye  movements   . 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 Hyperlipemia   . Seizures   . 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  Sees Sally Tommi Green at [facility]  Oct 2014.      PAST SURGICAL HISTORY: Past Surgical History  Procedure Laterality Date  . Shoulder surgery Left 2007  . Vaginal delivery      3 children    FAMILY HISTORY: Family History  Problem Relation Age of Onset  . Lymphoma Mother   . Alzheimer's disease Father   . Aneurysm Father   . Headache Father   . Headache Sister   . Cancer Sister     lung  . Cancer Other   . Aneurysm Paternal Uncle   . Cancer Maternal Aunt   . Cancer Paternal Aunt   . Leukemia Maternal Uncle     SOCIAL HISTORY: History   Social History  . Marital Status: Married    Spouse Name: Sally Green    Number of Children: 3  . Years of Education: 12   Occupational History  . retired    Social History Main Topics  . Smoking status: Former Smoker -- 1.25 packs/day for 50 years    Types: Cigarettes    Quit date: 06/24/2013  . Smokeless tobacco: Never Used  . Alcohol Use: No  . Drug Use: No  . Sexual Activity: Not on file   Other Topics Concern  . Not on file   Social History Narrative   Patient is married (Sally Green) and lives at home with her husband.   Patient has three children.   Patient is retired.   Patient has a high school education.   Patient is right-handed.   Consumes caffeine rarely.      PHYSICAL EXAM  Filed Vitals:   02/16/14 1510  BP: 128/62  Pulse: 74  Height: '5\' 4"'  (1.626 m)  Weight: 119 lb (53.978 kg)   Body mass index is 20.42 kg/(m^2).  Generalized: Well developed, in no acute distress   Neurological examination  Mentation: Alert oriented to time, place, history taking. Follows all commands speech and language fluent Cranial nerve II-XII: Pupils were equal round reactive to light. Extraocular movements  were full, visual field were full on confrontational test. Facial sensation and strength were normal. Uvula tongue midline. Head turning and shoulder shrug  were normal and symmetric. Motor: The motor testing reveals 5 over 5 strength of all 4 extremities. Good symmetric motor tone is noted throughout.  Sensory: Sensory testing is intact to soft touch on all 4 extremities. No evidence of extinction is noted.  Coordination: Cerebellar testing reveals good finger-nose-finger and heel-to-shin bilaterally.  Gait and station: Gait is normal. Tandem gait is normal. Romberg is negative. No drift is seen.  Reflexes: Deep tendon reflexes are  symmetric and normal bilaterally.    DIAGNOSTIC DATA (LABS, IMAGING, TESTING) - I reviewed patient records, labs, notes, testing and imaging myself where available.  Lab Results  Component Value Date   WBC 8.8 12/17/2009   HGB 16.8* 12/17/2009   HCT 48.8* 12/17/2009   MCV 95.1 12/17/2009   PLT 199 12/17/2009      Component Value Date/Time   NA 141 12/17/2009 0649   K 3.9 12/17/2009 0649   CL 108 12/17/2009 0649   CO2 28 12/17/2009 0649   GLUCOSE 92 12/17/2009 0649   BUN 11 12/17/2009 0649   CREATININE 0.63 12/17/2009 0649   CALCIUM 9.2 12/17/2009 0649   GFRNONAA >60 12/17/2009 0649   GFRAA  12/17/2009 0649    >60        The eGFR has been calculated using the MDRD equation. This calculation has not been validated in all clinical situations. eGFR's persistently <60 mL/min signify possible Chronic Kidney Disease.      ASSESSMENT AND PLAN 68 y.o. 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 here with:  1. Seizures  The patient is currently taking Dilantin 300 mg at bedtime and Lamictal 100 mg 2 tablets in  the morning and 3 tablets in the evening. The patient states that her last seizure was on November 9. I will check blood work today. The patient's husband passed away 4 weeks ago. She is having trouble staying asleep at night. I have suggested that the patient try over-the-counter melatonin 3 mg 1 hour before bedtime. If this is not beneficial she will let us know. The patient will follow up in 6 months or sooner if needed.   Ward Givens, MSN, NP-C 02/16/2014, 3:16 PM [facility] [address], [place], [place] [address] [phone]  Note: This document was prepared with digital dictation and possible smart phrase technology. Any transcriptional errors that result from this process are unintentional.

## 2014-02-17 LAB — CBC WITH DIFFERENTIAL
BASOS: 1 %
Basophils Absolute: 0 10*3/uL (ref 0.0–0.2)
EOS ABS: 0.1 10*3/uL (ref 0.0–0.4)
EOS: 2 %
HEMATOCRIT: 43.2 % (ref 34.0–46.6)
HEMOGLOBIN: 14.8 g/dL (ref 11.1–15.9)
Immature Grans (Abs): 0 10*3/uL (ref 0.0–0.1)
Immature Granulocytes: 0 %
LYMPHS: 49 %
Lymphocytes Absolute: 3.1 10*3/uL (ref 0.7–3.1)
MCH: 31.2 pg (ref 26.6–33.0)
MCHC: 34.3 g/dL (ref 31.5–35.7)
MCV: 91 fL (ref 79–97)
Monocytes Absolute: 0.7 10*3/uL (ref 0.1–0.9)
Monocytes: 12 %
NEUTROS ABS: 2.3 10*3/uL (ref 1.4–7.0)
Neutrophils Relative %: 36 %
Platelets: 260 10*3/uL (ref 150–379)
RBC: 4.75 x10E6/uL (ref 3.77–5.28)
RDW: 14.8 % (ref 12.3–15.4)
WBC: 6.3 10*3/uL (ref 3.4–10.8)

## 2014-02-17 LAB — COMPREHENSIVE METABOLIC PANEL
A/G RATIO: 1.9 (ref 1.1–2.5)
ALT: 15 IU/L (ref 0–32)
AST: 23 IU/L (ref 0–40)
Albumin: 4.2 g/dL (ref 3.6–4.8)
Alkaline Phosphatase: 109 IU/L (ref 39–117)
BUN/Creatinine Ratio: 21 (ref 11–26)
BUN: 13 mg/dL (ref 8–27)
CALCIUM: 9.5 mg/dL (ref 8.7–10.3)
CO2: 27 mmol/L (ref 18–29)
CREATININE: 0.63 mg/dL (ref 0.57–1.00)
Chloride: 99 mmol/L (ref 97–108)
GFR calc Af Amer: 106 mL/min/{1.73_m2} (ref >59)
GFR, EST NON AFRICAN AMERICAN: 92 mL/min/{1.73_m2} (ref >59)
GLOBULIN, TOTAL: 2.2 g/dL (ref 1.5–4.5)
GLUCOSE: 101 mg/dL — AB (ref 65–99)
Potassium: 3.6 mmol/L (ref 3.5–5.2)
Sodium: 141 mmol/L (ref 134–144)
TOTAL PROTEIN: 6.4 g/dL (ref 6.0–8.5)
Total Bilirubin: 0.3 mg/dL (ref 0.0–1.2)

## 2014-02-17 LAB — PHENYTOIN LEVEL, TOTAL: PHENYTOIN LVL: 8.9 ug/mL — AB (ref 10.0–20.0)

## 2014-02-17 LAB — LAMOTRIGINE LEVEL: Lamotrigine Lvl: 1.8 ug/mL — ABNORMAL LOW (ref 2.0–20.0)

## 2014-02-20 ENCOUNTER — Telehealth: Payer: Self-pay | Admitting: Adult Health

## 2014-02-20 DIAGNOSIS — R569 Unspecified convulsions: Secondary | ICD-10-CM

## 2014-02-20 MED ORDER — LAMOTRIGINE 100 MG PO TABS: ORAL_TABLET | ORAL | Status: DC

## 2014-02-20 NOTE — Telephone Encounter (Signed)
I called the patient. Her Lamictal level and Dilantin level were low. The patient continues to have sensory type seizures. I will increase her Lamictal to 2-1/2 tablets in the morning and 3 tablets in the evening. The patient will have her Lamictal level rechecked in 2 weeks.

## 2014-02-20 NOTE — Progress Notes (Signed)
I agree with the assessment and plan as directed by NP .The patient is known to me .   Aneesa Romey, MD  

## 2014-03-06 ENCOUNTER — Telehealth: Payer: Self-pay | Admitting: Adult Health

## 2014-03-06 DIAGNOSIS — Z5181 Encounter for therapeutic drug level monitoring: Secondary | ICD-10-CM

## 2014-03-06 NOTE — Telephone Encounter (Signed)
Called and advised patient to have lamictal rechecked at her convenience between 8-12 and 1-3 M-T and 8-12 F

## 2014-03-06 NOTE — Telephone Encounter (Signed)
Please call the patient and have her come in at her convenience to have blood work completed to recheck the lamictal level.

## 2014-03-08 ENCOUNTER — Other Ambulatory Visit (INDEPENDENT_AMBULATORY_CARE_PROVIDER_SITE_OTHER): Payer: Self-pay

## 2014-03-08 ENCOUNTER — Telehealth: Payer: Self-pay | Admitting: *Deleted

## 2014-03-08 DIAGNOSIS — Z0289 Encounter for other administrative examinations: Secondary | ICD-10-CM

## 2014-03-08 DIAGNOSIS — Z5181 Encounter for therapeutic drug level monitoring: Secondary | ICD-10-CM

## 2014-03-08 MED ORDER — SUVOREXANT 5 MG PO TABS: 5.0000 mg | ORAL_TABLET | Freq: Every evening | ORAL | Status: DC | PRN

## 2014-03-08 NOTE — Telephone Encounter (Signed)
I called the patient. She states she is still having a hard time staying asleep at night. She has tried melatonin without any benefit. She states that she normally wakes up around 3 or 3:30 in the morning and is unable to  Fall back asleep. I will start the patient on a low-dose of Belsomra.  She will take Belsomra  5 mg before bedtime. Patient verbalized understanding.

## 2014-03-08 NOTE — Telephone Encounter (Signed)
I called and LMVM for pt that would forward message to [facility] M/NP re: other med for sleep.  I was not able to ask, but am thinking that she has tried the melatonin 3mg  1 hour prior to sleep and this is not working.

## 2014-03-10 ENCOUNTER — Other Ambulatory Visit: Payer: Self-pay | Admitting: Neurology

## 2014-03-10 ENCOUNTER — Telehealth: Payer: Self-pay | Admitting: Adult Health

## 2014-03-10 NOTE — Telephone Encounter (Signed)
Pt is calling back stating she is still waiting on Suvorexant (BELSOMRA) 5 MG TABS to be sent to [facility].  Please call and advise.

## 2014-03-10 NOTE — Telephone Encounter (Signed)
This Rx has already been sent to the pharmacy.  I called the [facility].  Spoke with Evelena Leyden.  He said they did receive the Rx, however, have not entered it into the system yet.  He was a bit unpleasant saying we need to hire better IT people because he does not like the way our prescriptions print and says he would rate it 1 out of 10 for poorness.  I apologized he felt that way, and explained this is the way our Rx's are processed system wide.  I called the patient back.  She is aware.

## 2014-03-12 NOTE — Telephone Encounter (Signed)
Per note on 11/23

## 2014-03-13 ENCOUNTER — Telehealth: Payer: Self-pay | Admitting: Adult Health

## 2014-03-13 LAB — LAMOTRIGINE LEVEL: LAMOTRIGINE LVL: 5.4 ug/mL (ref 2.0–20.0)

## 2014-03-13 NOTE — Telephone Encounter (Signed)
I will call the patient. Her Lamictal level is lower. However she has not had any additional seizures. She will continue taking Lamictal as prescribed. She is still waiting for the pharmacy to fill her  Belsomra prescription. She states that the pharmacy that they would have ready by this afternoon

## 2014-06-05 ENCOUNTER — Ambulatory Visit: Payer: Self-pay | Admitting: Infectious Diseases

## 2014-07-14 ENCOUNTER — Other Ambulatory Visit: Payer: Self-pay | Admitting: Cardiothoracic Surgery

## 2014-07-14 DIAGNOSIS — J984 Other disorders of lung: Secondary | ICD-10-CM

## 2014-08-04 ENCOUNTER — Other Ambulatory Visit: Payer: Self-pay | Admitting: Neurology

## 2014-08-17 ENCOUNTER — Ambulatory Visit (INDEPENDENT_AMBULATORY_CARE_PROVIDER_SITE_OTHER): Payer: Medicare Other | Admitting: Neurology

## 2014-08-17 ENCOUNTER — Encounter: Payer: Self-pay | Admitting: Neurology

## 2014-08-17 VITALS — BP 120/70 | HR 76 | Resp 18 | Ht 64.96 in | Wt 120.5 lb

## 2014-08-17 DIAGNOSIS — F4321 Adjustment disorder with depressed mood: Secondary | ICD-10-CM

## 2014-08-17 DIAGNOSIS — F4381 Prolonged grief disorder: Secondary | ICD-10-CM

## 2014-08-17 DIAGNOSIS — F4329 Adjustment disorder with other symptoms: Secondary | ICD-10-CM | POA: Insufficient documentation

## 2014-08-17 DIAGNOSIS — Q283 Other malformations of cerebral vessels: Secondary | ICD-10-CM

## 2014-08-17 DIAGNOSIS — F32A Depression, unspecified: Secondary | ICD-10-CM | POA: Insufficient documentation

## 2014-08-17 DIAGNOSIS — F329 Major depressive disorder, single episode, unspecified: Secondary | ICD-10-CM | POA: Diagnosis not present

## 2014-08-17 DIAGNOSIS — R569 Unspecified convulsions: Secondary | ICD-10-CM | POA: Diagnosis not present

## 2014-08-17 DIAGNOSIS — Q282 Arteriovenous malformation of cerebral vessels: Secondary | ICD-10-CM

## 2014-08-17 DIAGNOSIS — R4701 Aphasia: Secondary | ICD-10-CM | POA: Insufficient documentation

## 2014-08-17 MED ORDER — PHENYTOIN SODIUM EXTENDED 100 MG PO CAPS: 300.0000 mg | ORAL_CAPSULE | Freq: Every day | ORAL | Status: DC

## 2014-08-17 MED ORDER — SERTRALINE HCL 50 MG PO TABS: 50.0000 mg | ORAL_TABLET | Freq: Every day | ORAL | Status: DC

## 2014-08-17 MED ORDER — LAMOTRIGINE 100 MG PO TABS: ORAL_TABLET | ORAL | Status: DC

## 2014-08-17 NOTE — Progress Notes (Signed)
PATIENT: Sally Green DOB: [birth year]  REASON FOR VISIT: follow up HISTORY FROM: patient  HISTORY OF PRESENT ILLNESS:  HISTORY 11/09/12 [facility]): The patient was diagnosed with an AV  in 2008 by Dr. Claiborne Billings , Dr Mable Fill in [place].  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  She has been  followed here at [facility] and parallel by Dr. Tommi Rumps at [facility].   The patient's spells are described as sensory spells the typical symptom of the tingling in the fingertips or even a whole course of sometimes the duration is less than a minute sometimes a bottle minutes there has never been a visible consult and she has never suffered any injuries related to one of these spells,  she does not lose awareness or consciousness. We performed a  Full EEG -polysomnography in August 2012. There was no seizure activity seen in sleep and the patient was neither excessively daytime sleepy nor did she have any apnea.  There was also no hypoxia noted. In August 20 13 she presented again with sensory spells the tingling of the fingertips and in dorsal. At that time she averaged 2 -3 brief spells per month. Her Dilantin level was therapeutic.  In January 2014, I increased the Lamictal doses in response to ongoing 2-3 seizures a month , paralleled to have decreased to one seizure a month for a brief period of time.  She takes Lamotrigine brand name lamictal - 100 mg tablets 2 in the morning and 3 in the evening ( brand-name only but she could not longer afford this and switch to a generic). She also takes Dilantin brand-name 300 mg at night. She has always kept a seizure diary and she brings on today. On 05-14-12 her electrolytes were all  In normal limits,  her Dilantin level was 7.3 , and her GFR was normal. She is scheduled to have repaeat labs next week- before she sees Dr Tommi Rumps.  She have to spells in March one in April, 3 [place], 3  in June, 2 in July and one fit in August all of the duration of 1-2 minutes or less.  She reports that she has trouble speaking and finding words during this period , having  speech arrest.   MM_ Sally Green is a 69 year old female with a history of AVM and seizures. She returns today for follow-up. She is currently taking Dilantin 300 mg at bedtime and lamictal 100 mg . Her last seizure was on Nov. 9th. She has several seizures since her last visit. She states that her seizures consist of a tingling sensation down the body.  On average her seizures will last 1-1.5 minutes. She states that in the last few years the longest she has gone without a seizure is 3 weeks. She operates a Teacher, music without difficulty. She is able to complete all ADLs independently. No changes in her gait or balance. The patient does report that her husband passed away 4 weeks ago. She states that she is having a hard time sleeping. She states that she wakes up several times a night.   CD Today's irevisit on 08-17-14 for this long-standing patient of mine. She endorsed today the Epworth sleepiness score at 5 points fatigue severity at 9 points and the depression scale geriatric depression scale at 1-2 points. The latter is remarkable because Sally Green lost her husband about 6 months ago. She now lives alone which has  been burdensome to her and she is also still not used to sleeping and living alone in a house she shared with her husband. She has family around and her grandson lives close by.   She reports her last seizure activity on a the third at about 7 AM she handed me a list of all seizures she has had in 2016.  [id number] at 7:45 PM for about 1 minute generally 28th 3:30 PM 1 minutes Fabry 10 7:30 PM 1-1/2-2 minutes for every 21st 5:50 AM 1 minute April 9 6:30 AM 1 minute April 23 19 p.m. 1 minute May 3 1 minute 7 AM. Most seizures ( tingling , speech arrest ) happen either right in the morning after she rises and  around 8:  PM. The medication has been unchanged, she states that she sleeps rather poorly. I would not be opposed to use a sleep aid for her as she has not had success with trying melatonin. Aware that sleep deprivation is also a seizure trigger. He has not been on antidepressants which may help her with her grieving . Her daughter was diagnosed at age 29 with lung cancer of the left lung , and is undergoing chemotherapy . She has her third treatment tomorrow. This also worries her mother of course.   REVIEW OF SYSTEMS: Out of a complete 14 system review of symptoms, the patient complains only of the following symptoms, and all other reviewed systems are negative.  Insomnia, depression, seizures. Anxiety - loss of husband and daughter diagnosed with lung cancer jan 2016.   ALLERGIES: Allergies  Allergen Reactions  . Depakote [Divalproex Sodium]   . Keppra [Levetiracetam]   . Topamax [Topiramate]   . Guaifenesin Palpitations    HOME MEDICATIONS: Outpatient Prescriptions Prior to Visit  Medication Sig Dispense Refill  . acetaminophen (TYLENOL) 325 MG tablet Take 650 mg by mouth every 6 (six) hours as needed.    Marland Kitchen atorvastatin (LIPITOR) 10 MG tablet Take 10 mg by mouth daily.    Marland Kitchen DILANTIN 100 MG ER capsule take 3 capsules by mouth at bedtime 270 capsule 0  . lamoTRIgine (LAMICTAL) 100 MG tablet Take two and one half in the morning and take three in late afternoon 495 tablet 3  . Suvorexant (BELSOMRA) 5 MG TABS Take 5 mg by mouth at bedtime as needed. 30 tablet 0   No facility-administered medications prior to visit.    PAST MEDICAL HISTORY: Past Medical History  Diagnosis Date  . Hypersomnia   . 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 Hyperlipemia   . Seizures   . 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  Sees dr Tommi Rumps at  [facility]  Oct 2014.      PAST SURGICAL HISTORY: Past Surgical History  Procedure Laterality Date  . Shoulder surgery Left 2007  . Vaginal delivery      3 children    FAMILY HISTORY: Family History  Problem Relation Age of Onset  . Lymphoma Mother   . Alzheimer's disease Father   . Aneurysm Father   . Headache Father   . Headache Sister   . Cancer Sister     lung  . Cancer Other   . Aneurysm Paternal Uncle   . Cancer Maternal Aunt   . Cancer Paternal Aunt   . Leukemia Maternal Uncle     SOCIAL HISTORY: History  Social History  . Marital Status: Married    Spouse Name: Wille Glaser  . Number of Children: 3  . Years of Education: 12   Occupational History  . retired    Social History Main Topics  . Smoking status: Former Smoker -- 1.25 packs/day for 50 years    Types: Cigarettes    Quit date: 06/24/2013  . Smokeless tobacco: Never Used  . Alcohol Use: No  . Drug Use: No  . Sexual Activity: Not on file   Other Topics Concern  . Not on file   Social History Narrative   Patient is married ([name]) and lives at home with her husband.   Patient has three children.   Patient is retired.   Patient has a high school education.   Patient is right-handed.   Consumes caffeine rarely.      PHYSICAL EXAM  Filed Vitals:   08/17/14 1455  BP: 120/70  Pulse: 76  Resp: 18  Height: 5' 4.96" (1.65 m)  Weight: 120 lb 8 oz (54.658 kg)   Body mass index is 20.08 kg/(m^2).  Generalized: Well developed, in no acute distress   Neurological examination  Mentation: Alert oriented to time, place, history taking. Follows all commands speech and language fluent Cranial nerve II-XII: Pupils were equal round reactive to light. Extraocular movements were full, visual field were full on confrontational test. Facial sensation and strength were normal. Uvula tongue midline. Head turning and shoulder shrug  were normal and symmetric. Motor: The motor testing reveals 5 over 5 strength of all 4  extremities. Good symmetric motor tone is noted throughout.  Sensory: Sensory testing is intact to soft touch on all 4 extremities. No evidence of extinction is noted.  Coordination: Cerebellar testing reveals good finger-nose-finger and heel-to-shin bilaterally.  Gait and station: Gait is normal. Tandem gait is normal. Romberg is negative. No drift is seen.  Reflexes: Deep tendon reflexes are symmetric and normal bilaterally.    DIAGNOSTIC DATA (LABS, IMAGING, TESTING) - I reviewed patient records, labs, notes, testing and imaging myself where available.  Lab Results  Component Value Date   WBC 6.3 02/16/2014   HGB 14.8 02/16/2014   HCT 43.2 02/16/2014   MCV 91 02/16/2014   PLT 260 02/16/2014      Component Value Date/Time   NA 141 02/16/2014 1539   NA 143 06/09/2013 1013   NA 141 12/17/2009 0649   K 3.6 02/16/2014 1539   K 3.5 06/09/2013 1013   CL 99 02/16/2014 1539   CL 104 06/09/2013 1013   CO2 27 02/16/2014 1539   CO2 32 06/09/2013 1013   GLUCOSE 101* 02/16/2014 1539   GLUCOSE 72 06/09/2013 1013   GLUCOSE 92 12/17/2009 0649   BUN 13 02/16/2014 1539   BUN 12 06/09/2013 1013   BUN 11 12/17/2009 0649   CREATININE 0.63 02/16/2014 1539   CREATININE 0.72 06/09/2013 1013   CALCIUM 9.5 02/16/2014 1539   CALCIUM 8.8 06/09/2013 1013   PROT 6.4 02/16/2014 1539   PROT 6.8 06/09/2013 1013   ALBUMIN 3.3* 06/09/2013 1013   AST 23 02/16/2014 1539   AST 17 06/09/2013 1013   ALT 15 02/16/2014 1539   ALT 16 06/09/2013 1013   ALKPHOS 109 02/16/2014 1539   ALKPHOS 109 06/09/2013 1013   BILITOT 0.3 02/16/2014 1539   GFRNONAA 92 02/16/2014 1539   GFRNONAA >60 06/09/2013 1013   GFRAA 106 02/16/2014 1539   GFRAA >60 06/09/2013 1013      ASSESSMENT AND PLAN  69 y.o. 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 here with:  1. Seizures  The patient is currently taking Dilantin 300 mg at bedtime  and Lamictal 100 mg 2 tablets in the morning and 3 tablets in the evening. Started Zoloft 50 mg in AM t.   The patient states that her last seizure was on November 9.The patient's husband passed October 2015 , 6 month ago.  She is having trouble staying asleep at night., failed  melatonin 3 mg 1 hour before bedtime.  She reports still grieving and having racing thoughts, worries at night, is scared living and sleeping alone.  The patient will follow up in 6 months or sooner if needed.   Larey Seat, MD   08/17/2014, 3:46 PM [facility] [address] [place], [place] [address] [phone]  Note: This document was prepared with digital dictation and possible smart phrase technology. Any transcriptional errors that result from this process are unintentional.

## 2014-08-17 NOTE — Patient Instructions (Signed)
Sertraline tablets What is this medicine? SERTRALINE (SER tra leen) is used to treat depression. It may also be used to treat obsessive compulsive disorder, panic disorder, post-trauma stress, premenstrual dysphoric disorder (PMDD) or social anxiety. This medicine may be used for other purposes; ask your health care provider or pharmacist if you have questions. COMMON BRAND NAME(S): Zoloft What should I tell my health care provider before I take this medicine? They need to know if you have any of these conditions: -bipolar disorder or a family history of bipolar disorder -diabetes -glaucoma -heart disease -high blood pressure -history of irregular heartbeat -history of low levels of calcium, magnesium, or potassium in the blood -if you often drink alcohol -liver disease -receiving electroconvulsive therapy -seizures -suicidal thoughts, plans, or attempt; a previous suicide attempt by you or a family member -thyroid disease -an unusual or allergic reaction to sertraline, other medicines, foods, dyes, or preservatives -pregnant or trying to get pregnant -breast-feeding How should I use this medicine? Take this medicine by mouth with a glass of water. Follow the directions on the prescription label. You can take it with or without food. Take your medicine at regular intervals. Do not take your medicine more often than directed. Do not stop taking this medicine suddenly except upon the advice of your doctor. Stopping this medicine too quickly may cause serious side effects or your condition may worsen. A special MedGuide will be given to you by the pharmacist with each prescription and refill. Be sure to read this information carefully each time. Talk to your pediatrician regarding the use of this medicine in children. While this drug may be prescribed for children as young as 7 years for selected conditions, precautions do apply. Overdosage: If you think you have taken too much of this  medicine contact a poison control center or emergency room at once. NOTE: This medicine is only for you. Do not share this medicine with others. What if I miss a dose? If you miss a dose, take it as soon as you can. If it is almost time for your next dose, take only that dose. Do not take double or extra doses. What may interact with this medicine? Do not take this medicine with any of the following medications: -certain medicines for fungal infections like fluconazole, itraconazole, ketoconazole, posaconazole, voriconazole -cisapride -disulfiram -dofetilide -linezolid -MAOIs like Carbex, Eldepryl, Marplan, Nardil, and Parnate -metronidazole -methylene blue (injected into a vein) -pimozide -thioridazine -ziprasidone This medicine may also interact with the following medications: -alcohol -aspirin and aspirin-like medicines -certain medicines for depression, anxiety, or psychotic disturbances -certain medicines for irregular heart beat like flecainide, propafenone -certain medicines for migraine headaches like almotriptan, eletriptan, frovatriptan, naratriptan, rizatriptan, sumatriptan, zolmitriptan -certain medicines for sleep -certain medicines for seizures like carbamazepine, valproic acid, phenytoin -certain medicines that treat or prevent blood clots like warfarin, enoxaparin, dalteparin -cimetidine -digoxin -diuretics -fentanyl -furazolidone -isoniazid -lithium -NSAIDs, medicines for pain and inflammation, like ibuprofen or naproxen -other medicines that prolong the QT interval (cause an abnormal heart rhythm) -procarbazine -rasagiline -supplements like St. John's wort, kava kava, valerian -tolbutamide -tramadol -tryptophan This list may not describe all possible interactions. Give your health care provider a list of all the medicines, herbs, non-prescription drugs, or dietary supplements you use. Also tell them if you smoke, drink alcohol, or use illegal drugs. Some  items may interact with your medicine. What should I watch for while using this medicine? Tell your doctor if your symptoms do not get better or if they get  worse. Visit your doctor or health care professional for regular checks on your progress. Because it may take several weeks to see the full effects of this medicine, it is important to continue your treatment as prescribed by your doctor. Patients and their families should watch out for new or worsening thoughts of suicide or depression. Also watch out for sudden changes in feelings such as feeling anxious, agitated, panicky, irritable, hostile, aggressive, impulsive, severely restless, overly excited and hyperactive, or not being able to sleep. If this happens, especially at the beginning of treatment or after a change in dose, call your health care professional. Dennis Bast may get drowsy or dizzy. Do not drive, use machinery, or do anything that needs mental alertness until you know how this medicine affects you. Do not stand or sit up quickly, especially if you are an older patient. This reduces the risk of dizzy or fainting spells. Alcohol may interfere with the effect of this medicine. Avoid alcoholic drinks. Your mouth may get dry. Chewing sugarless gum or sucking hard candy, and drinking plenty of water may help. Contact your doctor if the problem does not go away or is severe. What side effects may I notice from receiving this medicine? Side effects that you should report to your doctor or health care professional as soon as possible: -allergic reactions like skin rash, itching or hives, swelling of the face, lips, or tongue -black or bloody stools, blood in the urine or vomit -fast, irregular heartbeat -feeling faint or lightheaded, falls -hallucination, loss of contact with reality -seizures -suicidal thoughts or other mood changes -unusual bleeding or bruising -unusually weak or tired -vomiting Side effects that usually do not require  medical attention (report to your doctor or health care professional if they continue or are bothersome): -change in appetite -change in sex drive or performance -diarrhea -increased sweating -indigestion, nausea -tremors This list may not describe all possible side effects. Call your doctor for medical advice about side effects. You may report side effects to FDA at 1-800-FDA-1088. Where should I keep my medicine? Keep out of the reach of children. Store at room temperature between 15 and 30 degrees C (59 and 86 degrees F). Throw away any unused medicine after the expiration date. NOTE: This sheet is a summary. It may not cover all possible information. If you have questions about this medicine, talk to your doctor, pharmacist, or health care provider.  2015, [employer] Standard. (2012-10-12 12:57:35)

## 2014-12-28 ENCOUNTER — Inpatient Hospital Stay: Attending: Cardiothoracic Surgery | Admitting: Cardiothoracic Surgery

## 2015-01-12 ENCOUNTER — Other Ambulatory Visit: Payer: Self-pay | Admitting: Infectious Diseases

## 2015-01-12 DIAGNOSIS — Z1239 Encounter for other screening for malignant neoplasm of breast: Secondary | ICD-10-CM

## 2015-01-12 DIAGNOSIS — N6489 Other specified disorders of breast: Secondary | ICD-10-CM

## 2015-01-18 ENCOUNTER — Ambulatory Visit (INDEPENDENT_AMBULATORY_CARE_PROVIDER_SITE_OTHER): Payer: Medicare Other | Admitting: Adult Health

## 2015-01-18 ENCOUNTER — Encounter: Payer: Self-pay | Admitting: Adult Health

## 2015-01-18 VITALS — BP 142/68 | HR 68 | Ht 64.0 in | Wt 121.0 lb

## 2015-01-18 DIAGNOSIS — Z5181 Encounter for therapeutic drug level monitoring: Secondary | ICD-10-CM | POA: Diagnosis not present

## 2015-01-18 DIAGNOSIS — R569 Unspecified convulsions: Secondary | ICD-10-CM

## 2015-01-18 DIAGNOSIS — R4701 Aphasia: Secondary | ICD-10-CM

## 2015-01-18 NOTE — Progress Notes (Signed)
PATIENT: Sally Green DOB: 1945/12/12  REASON FOR VISIT: follow up- seizures, history of AVM HISTORY FROM: patient  HISTORY OF PRESENT ILLNESS: Sally Green is a 69 year old female with a history of AVM and sensory type seizures. She returns today for follow-up. She is currently taken Dilantin 300 mg at bedtime and Lamictal 100 mg 2 tablets in the morning and 3 tablets in the evening. She reports that she continues to have seizure episodes. She states that her seizures consist of a warm and tingling sensation that travels down the body not on one particular side of the body. She then will have loss of speech. She states this can last from anywhere from 1 minute to 1-1/2 minutes. She states that she cannot always tell when the episodes are coming on. She does not have any additional symptoms with these episodes. 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 She states that she tried this a week but did not notice the benefits so she stopped the medication. She states that she has a bone density scan today. Denies any significant changes with her gait or balance. She returns today for evaluation.  HISTORY 08/17/14 [facility]):  The patient was diagnosed with an AV in 2008 by Dr. Claiborne Billings , Dr Mable Fill in [place].  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 She has been followed here at [facility] and parallel by Dr. Tommi Rumps at [facility].  CD 08-17-14 for this long-standing patient of mine. She endorsed today the Epworth sleepiness score at 5 points fatigue severity at 9 points and the depression scale geriatric depression scale at 1-2 points. The latter is remarkable because Sally Green lost her husband about 6 months ago. She now lives alone which has been burdensome to her and she is also still not used to sleeping and living alone in a house she shared with her husband. She has  family around and her grandson lives close by.  She reports her last seizure activity on a the third at about 7 AM she handed me a list of all seizures she has had in 2016. [id number] at 7:45 PM for about 1 minute generally 28th 3:30 PM 1 minutes Fabry 10 7:30 PM 1-1/2-2 minutes for every 21st 5:50 AM 1 minute April 9 6:30 AM 1 minute April 23 19 p.m. 1 minute May 3 1 minute 7 AM. Most seizures ( tingling , speech arrest ) happen either right in the morning after she rises and around 8: PM. The medication has been unchanged, she states that she sleeps rather poorly. I would not be opposed to use a sleep aid for her as she has not had success with trying melatonin. Aware that sleep deprivation is also a seizure trigger. He has not been on antidepressants which may help her with her grieving . Her daughter was diagnosed at age 38 with lung cancer of the left lung , and is undergoing chemotherapy . She has her third treatment tomorrow. This also worries her mother of course.   REVIEW OF SYSTEMS: Out of a complete 14 system review of symptoms, the patient complains only of the following symptoms, and all other reviewed systems are negative.  Double vision, seizure, speech difficulty  ALLERGIES: Allergies  Allergen Reactions  . Depakote [Divalproex Sodium]   . Keppra [Levetiracetam]   . Topamax [Topiramate]   . Guaifenesin Palpitations    HOME MEDICATIONS: Outpatient  Prescriptions Prior to Visit  Medication Sig Dispense Refill  . acetaminophen (TYLENOL) 325 MG tablet Take 650 mg by mouth every 6 (six) hours as needed.    Marland Kitchen atorvastatin (LIPITOR) 10 MG tablet Take 10 mg by mouth daily.    Marland Kitchen lamoTRIgine (LAMICTAL) 100 MG tablet Take two and one half in the morning and take three in late afternoon 495 tablet 3  . phenytoin (DILANTIN) 100 MG ER capsule Take 3 capsules (300 mg total) by mouth at bedtime. 270 capsule 3  . sertraline (ZOLOFT) 50 MG tablet Take 1 tablet (50 mg total) by mouth  daily. 30 tablet 5   No facility-administered medications prior to visit.    PAST MEDICAL HISTORY: Past Medical History  Diagnosis Date  . Hypersomnia   . 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 Hyperlipemia   . Seizures   . 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  Sees dr Tommi Rumps at [facility]  Oct 2014.      PAST SURGICAL HISTORY: Past Surgical History  Procedure Laterality Date  . Shoulder surgery Left 2007  . Vaginal delivery      3 children    FAMILY HISTORY: Family History  Problem Relation Age of Onset  . Lymphoma Mother   . Alzheimer's disease Father   . Aneurysm Father   . Headache Father   . Headache Sister   . Cancer Sister     lung  . Cancer Other   . Aneurysm Paternal Uncle   . Cancer Maternal Aunt   . Cancer Paternal Aunt   . Leukemia Maternal Uncle     SOCIAL HISTORY: Social History   Social History  . Marital Status: Married    Spouse Name: Wille Glaser  . Number of Children: 3  . Years of Education: 12   Occupational History  . retired    Social History Main Topics  . Smoking status: Former Smoker -- 1.25 packs/day for 50 years    Types: Cigarettes    Quit date: 06/24/2013  . Smokeless tobacco: Never Used  . Alcohol Use: No  . Drug Use: No  . Sexual Activity: Not on file   Other Topics Concern  . Not on file   Social History Narrative   Patient is married ([name]) and lives at home with her husband.   Patient has three children.   Patient is retired.   Patient has a high school education.   Patient is right-handed.   Consumes caffeine rarely.      PHYSICAL EXAM  Filed Vitals:   01/18/15 0818  BP: 142/68  Pulse: 68  Height: '5\' 4"'$  (1.626 m)  Weight: 121 lb (54.885 kg)   Body mass index is 20.76 kg/(m^2).  Generalized: Well developed, in no acute distress    Neurological examination  Mentation: Alert oriented to time, place, history taking. Follows all commands speech and language fluent Cranial nerve II-XII: Pupils were equal round reactive to light. Extraocular movements were full, visual field were full on confrontational test. Facial sensation and strength were normal. Uvula tongue midline. Head turning and shoulder shrug  were normal and symmetric. Motor: The motor testing reveals 5 over 5 strength of all 4 extremities. Good symmetric motor tone is noted throughout.  Sensory: Sensory testing is intact to soft touch on all 4 extremities. No evidence of extinction is noted.  Coordination: Cerebellar  testing reveals good finger-nose-finger and heel-to-shin bilaterally.  Gait and station: Gait is normal. Tandem gait is normal. Romberg is negative. No drift is seen.  Reflexes: Deep tendon reflexes are symmetric and normal bilaterally.   DIAGNOSTIC DATA (LABS, IMAGING, TESTING) - I reviewed patient records, labs, notes, testing and imaging myself where available.  Lab Results  Component Value Date   WBC 6.3 02/16/2014   HGB 14.8 02/16/2014   HCT 43.2 02/16/2014   MCV 91 02/16/2014   PLT 260 02/16/2014      Component Value Date/Time   NA 141 02/16/2014 1539   NA 143 06/09/2013 1013   NA 141 12/17/2009 0649   K 3.6 02/16/2014 1539   K 3.5 06/09/2013 1013   CL 99 02/16/2014 1539   CL 104 06/09/2013 1013   CO2 27 02/16/2014 1539   CO2 32 06/09/2013 1013   GLUCOSE 101* 02/16/2014 1539   GLUCOSE 72 06/09/2013 1013   GLUCOSE 92 12/17/2009 0649   BUN 13 02/16/2014 1539   BUN 12 06/09/2013 1013   BUN 11 12/17/2009 0649   CREATININE 0.63 02/16/2014 1539   CREATININE 0.72 06/09/2013 1013   CALCIUM 9.5 02/16/2014 1539   CALCIUM 8.8 06/09/2013 1013   PROT 6.4 02/16/2014 1539   PROT 6.8 06/09/2013 1013   ALBUMIN 4.2 02/16/2014 1539   ALBUMIN 3.3* 06/09/2013 1013   AST 23 02/16/2014 1539   AST 17 06/09/2013 1013   ALT 15 02/16/2014  1539   ALT 16 06/09/2013 1013   ALKPHOS 109 02/16/2014 1539   ALKPHOS 109 06/09/2013 1013   BILITOT 0.3 02/16/2014 1539   BILITOT 0.3 06/09/2013 1013   GFRNONAA 92 02/16/2014 1539   GFRNONAA >60 06/09/2013 1013   GFRAA 106 02/16/2014 1539   GFRAA >60 06/09/2013 1013      ASSESSMENT AND PLAN 69 y.o. 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 here with:  1. Seizures  The patient continues to have sensory type seizures. At this time she does not want to start any additional medication. I will check blood work today. Patient advised that if she wants to restart Zoloft this medication can take 6-8 weeks before she starts to see the effects. She verbalized understanding. Patient will follow-up in 6 months or sooner if needed.  Ward Givens, MSN, NP-C 01/18/2015, 8:24 AM [facility] [address], [place], [place] [address] [phone]

## 2015-01-18 NOTE — Progress Notes (Signed)
I agree with the assessment and plan as directed by NP .The patient is known to me .   Evonda Enge, MD  

## 2015-01-18 NOTE — Patient Instructions (Signed)
Continue Dilantin and Lamictal I will check blood work today. Zoloft- takes 6-8 weeks to see the effects.

## 2015-01-22 ENCOUNTER — Telehealth: Payer: Self-pay

## 2015-01-22 NOTE — Telephone Encounter (Signed)
-----   Message from Ward Givens, NP sent at 01/22/2015  9:46 AM EDT ----- Lab work is ok. Still waiting on lamictal level. Please call patient.

## 2015-01-22 NOTE — Telephone Encounter (Signed)
Spoke to patient. Gave lab results. Patient verbalized understanding.  

## 2015-01-23 ENCOUNTER — Telehealth: Payer: Self-pay

## 2015-01-23 LAB — CBC WITH DIFFERENTIAL/PLATELET
BASOS: 0 %
Basophils Absolute: 0 10*3/uL (ref 0.0–0.2)
EOS (ABSOLUTE): 0 10*3/uL (ref 0.0–0.4)
EOS: 1 %
HEMATOCRIT: 43.9 % (ref 34.0–46.6)
Hemoglobin: 14.5 g/dL (ref 11.1–15.9)
Immature Grans (Abs): 0 10*3/uL (ref 0.0–0.1)
Immature Granulocytes: 0 %
LYMPHS ABS: 2 10*3/uL (ref 0.7–3.1)
Lymphs: 34 %
MCH: 30.8 pg (ref 26.6–33.0)
MCHC: 33 g/dL (ref 31.5–35.7)
MCV: 93 fL (ref 79–97)
MONOS ABS: 0.7 10*3/uL (ref 0.1–0.9)
Monocytes: 11 %
Neutrophils Absolute: 3.1 10*3/uL (ref 1.4–7.0)
Neutrophils: 54 %
Platelets: 235 10*3/uL (ref 150–379)
RBC: 4.71 x10E6/uL (ref 3.77–5.28)
RDW: 14.4 % (ref 12.3–15.4)
WBC: 5.8 10*3/uL (ref 3.4–10.8)

## 2015-01-23 LAB — COMPREHENSIVE METABOLIC PANEL
ALBUMIN: 4.1 g/dL (ref 3.5–4.8)
ALT: 11 IU/L (ref 0–32)
AST: 16 IU/L (ref 0–40)
Albumin/Globulin Ratio: 1.7 (ref 1.1–2.5)
Alkaline Phosphatase: 109 IU/L (ref 39–117)
BILIRUBIN TOTAL: 0.2 mg/dL (ref 0.0–1.2)
BUN / CREAT RATIO: 16 (ref 11–26)
BUN: 9 mg/dL (ref 8–27)
CHLORIDE: 101 mmol/L (ref 97–106)
CO2: 27 mmol/L (ref 18–29)
Calcium: 9.9 mg/dL (ref 8.7–10.3)
Creatinine, Ser: 0.56 mg/dL — ABNORMAL LOW (ref 0.57–1.00)
GFR calc Af Amer: 109 mL/min/{1.73_m2} (ref >59)
GFR calc non Af Amer: 95 mL/min/{1.73_m2} (ref >59)
GLOBULIN, TOTAL: 2.4 g/dL (ref 1.5–4.5)
Glucose: 91 mg/dL (ref 65–99)
Potassium: 5.7 mmol/L — ABNORMAL HIGH (ref 3.5–5.2)
SODIUM: 142 mmol/L (ref 136–144)
Total Protein: 6.5 g/dL (ref 6.0–8.5)

## 2015-01-23 LAB — LAMOTRIGINE LEVEL: Lamotrigine Lvl: 5.2 ug/mL (ref 2.0–20.0)

## 2015-01-23 LAB — PHENYTOIN LEVEL, TOTAL: Phenytoin (Dilantin), Serum: 18.4 ug/mL (ref 10.0–20.0)

## 2015-01-23 NOTE — Telephone Encounter (Signed)
-----   Message from Ward Givens, NP sent at 01/23/2015  8:01 AM EDT ----- lamictal level in normal range. Please call patient.

## 2015-01-23 NOTE — Telephone Encounter (Signed)
Spoke to patient. Gave lab results. Patient verbalized understanding.  

## 2015-01-25 ENCOUNTER — Inpatient Hospital Stay: Payer: Medicare Other | Attending: Cardiothoracic Surgery | Admitting: Cardiothoracic Surgery

## 2015-01-25 ENCOUNTER — Ambulatory Visit
Admission: RE | Admit: 2015-01-25 | Discharge: 2015-01-25 | Disposition: A | Discharge status: Home / Self Care | Payer: Medicare Other | Source: Ambulatory Visit | Attending: Cardiothoracic Surgery | Admitting: Cardiothoracic Surgery

## 2015-01-25 VITALS — BP 149/67 | HR 69 | Temp 96.5°F | Resp 18 | Ht 64.0 in | Wt 121.0 lb

## 2015-01-25 DIAGNOSIS — Z09 Encounter for follow-up examination after completed treatment for conditions other than malignant neoplasm: Secondary | ICD-10-CM | POA: Diagnosis present

## 2015-01-25 DIAGNOSIS — R918 Other nonspecific abnormal finding of lung field: Secondary | ICD-10-CM | POA: Insufficient documentation

## 2015-01-25 DIAGNOSIS — J984 Other disorders of lung: Secondary | ICD-10-CM

## 2015-01-25 DIAGNOSIS — R911 Solitary pulmonary nodule: Secondary | ICD-10-CM | POA: Diagnosis present

## 2015-01-25 NOTE — Progress Notes (Signed)
Patient is here for follow-up of lung mass and CT Scan results. Patient states that she has been feeling fine and has no complaints today.

## 2015-01-25 NOTE — Progress Notes (Signed)
[facility] Post-Op Note  Patient ID: Sally Green, female   DOB: 05-Feb-1946, 69 y.o.   MRN: [id number]  HISTORY: She returns today in follow-up. 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 She states that she's back to smoking. She's had some recent social he was including the death of her husband and a recent diagnosis of lung cancer in her daughter. She does not complain of any significant shortness of breath however. She has undergone an ultrasound of her neck within the last week or 2. We will attempt to get those results.   Filed Vitals:   01/25/15 0903  BP:   Pulse:   Temp: 96.5 F (35.8 C)  Resp:      EXAM: Resp: Lungs are clear bilaterally.  No respiratory distress, normal effort. Heart:  Regular without murmurs Abd:  Abdomen is soft, non distended and non tender. No masses are palpable.  There is no rebound and no guarding.  Neurological: Alert and oriented to person, place, and time. Coordination normal.  Skin: Skin is warm and dry. No rash noted. No diaphoretic. No erythema. No pallor.  Psychiatric: Normal mood and affect. Normal behavior. Judgment and thought content normal.    ASSESSMENT: I have independently reviewed the patient's CT scan which she had made today. There is a new right upper lobe mass with an adjacent smaller lesion. 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 The previously diagnosed left lower lobe mass is stable.   PLAN:   We will obtain a PET scan and pulmonary function studies and see the patient back again in one week    Nestor Lewandowsky, MD

## 2015-01-30 ENCOUNTER — Ambulatory Visit
Admission: RE | Admit: 2015-01-30 | Discharge: 2015-01-30 | Disposition: A | Discharge status: Home / Self Care | Payer: Medicare Other | Source: Ambulatory Visit | Attending: Infectious Diseases | Admitting: Infectious Diseases

## 2015-01-30 ENCOUNTER — Other Ambulatory Visit: Payer: Self-pay | Admitting: Infectious Diseases

## 2015-01-30 DIAGNOSIS — N6489 Other specified disorders of breast: Secondary | ICD-10-CM

## 2015-01-30 DIAGNOSIS — Z1239 Encounter for other screening for malignant neoplasm of breast: Secondary | ICD-10-CM

## 2015-01-30 DIAGNOSIS — R928 Other abnormal and inconclusive findings on diagnostic imaging of breast: Secondary | ICD-10-CM | POA: Diagnosis not present

## 2015-01-31 ENCOUNTER — Ambulatory Visit
Admission: RE | Admit: 2015-01-31 | Discharge: 2015-01-31 | Disposition: A | Discharge status: Home / Self Care | Payer: Medicare Other | Source: Ambulatory Visit | Attending: Cardiothoracic Surgery | Admitting: Cardiothoracic Surgery

## 2015-01-31 DIAGNOSIS — J984 Other disorders of lung: Secondary | ICD-10-CM

## 2015-02-01 ENCOUNTER — Ambulatory Visit: Payer: Medicare Other | Attending: Cardiothoracic Surgery

## 2015-02-01 ENCOUNTER — Ambulatory Visit: Payer: Medicare Other

## 2015-02-01 ENCOUNTER — Inpatient Hospital Stay: Admitting: Cardiothoracic Surgery

## 2015-02-01 DIAGNOSIS — J984 Other disorders of lung: Secondary | ICD-10-CM

## 2015-02-02 ENCOUNTER — Ambulatory Visit: Admission: RE | Admit: 2015-02-02 | Payer: Medicare Other | Source: Ambulatory Visit

## 2015-02-06 ENCOUNTER — Other Ambulatory Visit: Payer: Self-pay | Admitting: Cardiothoracic Surgery

## 2015-02-06 DIAGNOSIS — J984 Other disorders of lung: Secondary | ICD-10-CM

## 2015-02-07 ENCOUNTER — Ambulatory Visit
Admission: RE | Admit: 2015-02-07 | Discharge: 2015-02-07 | Disposition: A | Discharge status: Home / Self Care | Payer: Medicare Other | Source: Ambulatory Visit | Attending: Cardiothoracic Surgery | Admitting: Cardiothoracic Surgery

## 2015-02-08 ENCOUNTER — Ambulatory Visit: Payer: Medicare Other | Admitting: Cardiothoracic Surgery

## 2015-02-09 ENCOUNTER — Encounter
Admission: RE | Admit: 2015-02-09 | Discharge: 2015-02-09 | Disposition: A | Discharge status: Home / Self Care | Payer: Medicare Other | Source: Ambulatory Visit | Attending: Cardiothoracic Surgery | Admitting: Cardiothoracic Surgery

## 2015-02-09 DIAGNOSIS — J984 Other disorders of lung: Secondary | ICD-10-CM | POA: Diagnosis not present

## 2015-02-09 LAB — GLUCOSE, CAPILLARY: GLUCOSE-CAPILLARY: 78 mg/dL (ref 65–99)

## 2015-02-09 MED ORDER — FLUDEOXYGLUCOSE F - 18 (FDG) INJECTION
11.8700 | Freq: Once | INTRAVENOUS | Status: DC | PRN
  Administered 2015-02-09: 11.87 via INTRAVENOUS
  Filled 2015-02-09: qty 11.87

## 2015-02-15 ENCOUNTER — Inpatient Hospital Stay: Payer: Medicare Other | Attending: Cardiothoracic Surgery | Admitting: Cardiothoracic Surgery

## 2015-02-15 VITALS — BP 161/81 | HR 87 | Temp 97.9°F | Resp 18 | Ht 64.0 in | Wt 121.9 lb

## 2015-02-15 DIAGNOSIS — R918 Other nonspecific abnormal finding of lung field: Secondary | ICD-10-CM

## 2015-02-15 DIAGNOSIS — C3411 Malignant neoplasm of upper lobe, right bronchus or lung: Secondary | ICD-10-CM | POA: Insufficient documentation

## 2015-02-15 NOTE — Progress Notes (Signed)
Patient is here for follow-up of lung mass and PET scan results. Patient states that she has been feeling good overall.

## 2015-02-15 NOTE — Progress Notes (Signed)
Sally Green Inpatient Post-Op Note  Patient ID: Sally Green, female   DOB: May 29, 1945, 68 y.o.   MRN: [id number]  HISTORY: Sally Green returns today in follow-up. She did have a PET scan done which shows intense uptake in the right upper lobe mass most consistent with a pulmonary malignancy. The patient continues to smoke. She did have some pulmonary function studies done which reveal an FEV1 and DLCO of approximate 50%. The lesion in the right upper lobe is most likely require a right upper lobectomy.   Filed Vitals:   02/15/15 1016  BP: 161/81  Pulse: 87  Temp: 97.9 F (36.6 C)  Resp: 18     EXAM: Resp: Lungs are clear bilaterally.  No respiratory distress, normal effort. Heart:  Regular without murmurs Abd:  Abdomen is soft, non distended and non tender. No masses are palpable.  There is no rebound and no guarding.  Neurological: Alert and oriented to person, place, and time. Coordination normal.  Skin: Skin is warm and dry. No rash noted. No diaphoretic. No erythema. No pallor.  Psychiatric: Normal mood and affect. Normal behavior. Judgment and thought content normal.    ASSESSMENT: I had a long discussion with her and her daughter today. We discussed the options of surgical resection versus radiation therapy. I explained her that at the present time I thought that her pulmonary functions were marginal for pulmonary resection. I did ask her to consider pulmonary rehabilitation with smoking cessation. She would like to think about that. In the interim we'll go ahead and set her up to see Sally Green. Sally Green was performed a bronchoscopy on her daughter and she would like to proceed down that route. Perhaps her something he may do to improve her overall pulmonary function so that surgical intervention may be entertained. In addition we'll ask Sally Green to see the patient to give [place] his opinion on whether or not she would be a candidate for radiation therapy.   PLAN:   We will set  her up an appointment with Sally Green and Sally Green. She'll come back to see me as needed.    Sally Lewandowsky, MD

## 2015-02-20 ENCOUNTER — Telehealth: Payer: Self-pay

## 2015-02-20 NOTE — Telephone Encounter (Signed)
  Oncology Nurse Navigator Documentation        Notified Sally Green regarding her PAT appt 02/27/15 0945 at the [facility] [address]. ENB scheduled for 03/07/15 at 1300 with arrival time of 1200 at the [facility] entrance. Dr Mortimer Fries to perform. She denies any anticoagulation use. Readback performed. Provided her with my name/number for any future questions or concerns.

## 2015-02-27 ENCOUNTER — Encounter
Admission: RE | Admit: 2015-02-27 | Discharge: 2015-02-27 | Disposition: A | Discharge status: Home / Self Care | Payer: Medicare Other | Source: Ambulatory Visit | Attending: Internal Medicine | Admitting: Internal Medicine

## 2015-02-27 DIAGNOSIS — Z01812 Encounter for preprocedural laboratory examination: Secondary | ICD-10-CM | POA: Diagnosis not present

## 2015-02-27 DIAGNOSIS — Z01818 Encounter for other preprocedural examination: Secondary | ICD-10-CM | POA: Insufficient documentation

## 2015-02-27 DIAGNOSIS — R569 Unspecified convulsions: Secondary | ICD-10-CM | POA: Diagnosis not present

## 2015-02-27 HISTORY — DX: Other nonspecific abnormal finding of lung field: R91.8

## 2015-02-27 HISTORY — DX: Depression, unspecified: F32.A

## 2015-02-27 HISTORY — DX: Major depressive disorder, single episode, unspecified: F32.9

## 2015-02-27 HISTORY — DX: Unspecified fracture of unspecified foot, initial encounter for closed fracture: S92.909A

## 2015-02-27 LAB — BASIC METABOLIC PANEL
ANION GAP: 6 (ref 5–15)
BUN: 14 mg/dL (ref 6–20)
CALCIUM: 9.5 mg/dL (ref 8.9–10.3)
CO2: 31 mmol/L (ref 22–32)
Chloride: 102 mmol/L (ref 101–111)
Creatinine, Ser: 0.58 mg/dL (ref 0.44–1.00)
Glucose, Bld: 91 mg/dL (ref 65–99)
Potassium: 4.1 mmol/L (ref 3.5–5.1)
Sodium: 139 mmol/L (ref 135–145)

## 2015-02-27 NOTE — Patient Instructions (Signed)
  Your procedure is scheduled on: 03/07/15 wed Report to Day Surgery.2nd floor medical mall To find out your arrival time please call [phone] between 1PM - 3PM on 03/06/15 Tues Remember: Instructions that are not followed completely may result in serious medical risk, up to and including death, or upon the discretion of your surgeon and anesthesiologist your surgery may need to be rescheduled.    _x___ 1. Do not eat food or drink liquids after midnight. No gum chewing or hard candies.     ____ 2. No Alcohol for 24 hours before or after surgery.   ____ 3. Bring all medications with you on the day of surgery if instructed.    ___x_ 4. Notify your doctor if there is any change in your medical condition     (cold, fever, infections).     Do not wear jewelry, make-up, hairpins, clips or nail polish.  Do not wear lotions, powders, or perfumes. You may wear deodorant.  Do not shave 48 hours prior to surgery. Men may shave face and neck.  Do not bring valuables to the hospital.    [facility] is not responsible for any belongings or valuables.               Contacts, dentures or bridgework may not be worn into surgery.  Leave your suitcase in the car. After surgery it may be brought to your room.  For patients admitted to the hospital, discharge time is determined by your                treatment team.   Patients discharged the day of surgery will not be allowed to drive home.   Please read over the following fact sheets that you were given:      _x___ Take these medicines the morning of surgery with A SIP OF WATER:    1.lamoTRIgine (LAMICTAL) 100 MG tablet  2.   3.   4.  5.  6.  ____ Fleet Enema (as directed)   ____ Use CHG Soap as directed  ____ Use inhalers on the day of surgery  ____ Stop metformin 2 days prior to surgery    ____ Take 1/2 of usual insulin dose the night before surgery and none on the morning of surgery.   ____ Stop Coumadin/Plavix/aspirin on   ____  Stop Anti-inflammatories on    ____ Stop supplements until after surgery.    ____ Bring C-Pap to the hospital.

## 2015-03-07 ENCOUNTER — Ambulatory Visit: Payer: Medicare Other

## 2015-03-07 ENCOUNTER — Ambulatory Visit
Admission: RE | Admit: 2015-03-07 | Discharge: 2015-03-07 | Disposition: A | Discharge status: Home / Self Care | Payer: Medicare Other | Source: Ambulatory Visit | Attending: Internal Medicine | Admitting: Internal Medicine

## 2015-03-07 ENCOUNTER — Encounter: Payer: Self-pay | Admitting: *Deleted

## 2015-03-07 ENCOUNTER — Ambulatory Visit: Payer: Medicare Other | Admitting: Anesthesiology

## 2015-03-07 ENCOUNTER — Encounter
Admission: RE | Disposition: A | Discharge status: Home / Self Care | Payer: Self-pay | Source: Ambulatory Visit | Attending: Internal Medicine

## 2015-03-07 DIAGNOSIS — R918 Other nonspecific abnormal finding of lung field: Secondary | ICD-10-CM | POA: Diagnosis not present

## 2015-03-07 DIAGNOSIS — J439 Emphysema, unspecified: Secondary | ICD-10-CM | POA: Insufficient documentation

## 2015-03-07 DIAGNOSIS — F17200 Nicotine dependence, unspecified, uncomplicated: Secondary | ICD-10-CM | POA: Diagnosis not present

## 2015-03-07 DIAGNOSIS — C3411 Malignant neoplasm of upper lobe, right bronchus or lung: Secondary | ICD-10-CM | POA: Diagnosis not present

## 2015-03-07 DIAGNOSIS — Q2730 Arteriovenous malformation, site unspecified: Secondary | ICD-10-CM | POA: Insufficient documentation

## 2015-03-07 DIAGNOSIS — G4710 Hypersomnia, unspecified: Secondary | ICD-10-CM | POA: Diagnosis not present

## 2015-03-07 DIAGNOSIS — F329 Major depressive disorder, single episode, unspecified: Secondary | ICD-10-CM | POA: Insufficient documentation

## 2015-03-07 DIAGNOSIS — E785 Hyperlipidemia, unspecified: Secondary | ICD-10-CM | POA: Insufficient documentation

## 2015-03-07 DIAGNOSIS — G40909 Epilepsy, unspecified, not intractable, without status epilepticus: Secondary | ICD-10-CM | POA: Insufficient documentation

## 2015-03-07 HISTORY — PX: ELECTROMAGNETIC NAVIGATION BROCHOSCOPY: SHX5369

## 2015-03-07 SURGERY — ELECTROMAGNETIC NAVIGATION BRONCHOSCOPY
Anesthesia: General

## 2015-03-07 MED ORDER — ONDANSETRON HCL 4 MG/2ML IJ SOLN
INTRAMUSCULAR | Status: DC | PRN
  Administered 2015-03-07: 4 mg via INTRAVENOUS

## 2015-03-07 MED ORDER — SUCCINYLCHOLINE CHLORIDE 20 MG/ML IJ SOLN
INTRAMUSCULAR | Status: DC | PRN
  Administered 2015-03-07: 100 mg via INTRAVENOUS

## 2015-03-07 MED ORDER — OXYCODONE HCL 5 MG/5ML PO SOLN: 5.0000 mg | Freq: Once | ORAL | Status: DC | PRN

## 2015-03-07 MED ORDER — IPRATROPIUM-ALBUTEROL 0.5-2.5 (3) MG/3ML IN SOLN
3.0000 mL | Freq: Once | RESPIRATORY_TRACT | Status: AC
  Administered 2015-03-07: 3 mL via RESPIRATORY_TRACT

## 2015-03-07 MED ORDER — IPRATROPIUM-ALBUTEROL 0.5-2.5 (3) MG/3ML IN SOLN: 3.0000 mL | Freq: Once | RESPIRATORY_TRACT | Status: DC | PRN

## 2015-03-07 MED ORDER — PROPOFOL 10 MG/ML IV BOLUS
INTRAVENOUS | Status: DC | PRN
  Administered 2015-03-07: 120 mg via INTRAVENOUS

## 2015-03-07 MED ORDER — MIDAZOLAM HCL 5 MG/5ML IJ SOLN
INTRAMUSCULAR | Status: DC | PRN
  Administered 2015-03-07: 1 mg via INTRAVENOUS

## 2015-03-07 MED ORDER — EPHEDRINE SULFATE 50 MG/ML IJ SOLN
INTRAMUSCULAR | Status: DC | PRN
  Administered 2015-03-07: 10 mg via INTRAVENOUS

## 2015-03-07 MED ORDER — LACTATED RINGERS IV SOLN
INTRAVENOUS | Status: DC
  Administered 2015-03-07: 13:00:00 via INTRAVENOUS

## 2015-03-07 MED ORDER — LIDOCAINE HCL (CARDIAC) 20 MG/ML IV SOLN
INTRAVENOUS | Status: DC | PRN
  Administered 2015-03-07: 45 mg via INTRAVENOUS

## 2015-03-07 MED ORDER — IPRATROPIUM-ALBUTEROL 0.5-2.5 (3) MG/3ML IN SOLN
RESPIRATORY_TRACT | Status: AC
Start: 2015-03-07 — End: 2015-03-07
  Administered 2015-03-07: 3 mL via RESPIRATORY_TRACT
  Filled 2015-03-07: qty 3

## 2015-03-07 MED ORDER — FAMOTIDINE 20 MG PO TABS
20.0000 mg | ORAL_TABLET | Freq: Once | ORAL | Status: AC
  Administered 2015-03-07: 20 mg via ORAL

## 2015-03-07 MED ORDER — PHENYLEPHRINE HCL 10 MG/ML IJ SOLN
INTRAMUSCULAR | Status: DC | PRN
  Administered 2015-03-07: 50 ug via INTRAVENOUS
  Administered 2015-03-07: 100 ug via INTRAVENOUS

## 2015-03-07 MED ORDER — ROCURONIUM BROMIDE 100 MG/10ML IV SOLN
INTRAVENOUS | Status: DC | PRN
  Administered 2015-03-07: 5 mg via INTRAVENOUS
  Administered 2015-03-07 (×3): 10 mg via INTRAVENOUS

## 2015-03-07 MED ORDER — FENTANYL CITRATE (PF) 100 MCG/2ML IJ SOLN: 25.0000 ug | INTRAMUSCULAR | Status: DC | PRN

## 2015-03-07 MED ORDER — SUGAMMADEX SODIUM 200 MG/2ML IV SOLN
INTRAVENOUS | Status: DC | PRN
  Administered 2015-03-07: 100 mg via INTRAVENOUS

## 2015-03-07 MED ORDER — FAMOTIDINE 20 MG PO TABS
ORAL_TABLET | ORAL | Status: AC
  Administered 2015-03-07: 20 mg via ORAL
  Filled 2015-03-07: qty 1

## 2015-03-07 MED ORDER — FENTANYL CITRATE (PF) 100 MCG/2ML IJ SOLN
INTRAMUSCULAR | Status: DC | PRN
  Administered 2015-03-07: 100 ug via INTRAVENOUS

## 2015-03-07 MED ORDER — OXYCODONE HCL 5 MG PO TABS: 5.0000 mg | ORAL_TABLET | Freq: Once | ORAL | Status: DC | PRN

## 2015-03-07 NOTE — Interval H&P Note (Signed)
History and Physical Interval Note:  03/07/2015 12:40 PM  Sally Green  has presented today for surgery, with the diagnosis of right upper lung pet nodule.  The various methods of treatment have been discussed with the patient and family. After consideration of risks, benefits and other options for treatment, the patient has consented to  Procedure(s): ELECTROMAGNETIC NAVIGATION BRONCHOSCOPY (N/A) as a surgical intervention .  The patient's history has been reviewed, patient examined, no change in status, stable for surgery.  I have reviewed the patient's chart and labs.  Questions were answered to the patient's satisfaction.     Flora Lipps

## 2015-03-07 NOTE — Anesthesia Preprocedure Evaluation (Signed)
Anesthesia Evaluation  Patient identified by MRN, date of birth, ID band Patient awake    Reviewed: Allergy & Precautions, H&P , NPO status , Patient's Chart, lab work & pertinent test results  History of Anesthesia Complications Negative for: history of anesthetic complications  Airway Mallampati: III  TM Distance: >3 FB Neck ROM: limited    Dental  (+) Poor Dentition, Chipped, Missing, Partial Upper, Partial Lower   Pulmonary neg shortness of breath, COPD, former smoker,    Pulmonary exam normal breath sounds clear to auscultation       Cardiovascular Exercise Tolerance: Good (-) angina(-) Past MI and (-) DOE negative cardio ROS Normal cardiovascular exam Rhythm:regular Rate:Normal     Neuro/Psych Seizures -, Poorly Controlled,  PSYCHIATRIC DISORDERS Depression    GI/Hepatic negative GI ROS, Neg liver ROS, neg GERD  ,  Endo/Other  negative endocrine ROS  Renal/GU negative Renal ROS  negative genitourinary   Musculoskeletal   Abdominal   Peds  Hematology negative hematology ROS (+)   Anesthesia Other Findings Past Medical History:   Hypersomnia                                                  Nystagmus with deficiency of saccadic eye move*              AVM (arteriovenous malformation)                11/1973      Hyperlipemia                                                 Seizures ([place])                                               Other forms of epilepsy and recurrent seizures* 11/09/2012      Comment: Arterio-venus malformation related seizures.                Sees dr Tommi Rumps at [facility]  Oct 2014.     AVM (arteriovenous malformation)                             Depression                                                   Fracture, foot                                                 Comment:Right    Lung mass  Past Surgical History:   SHOULDER SURGERY                                 Left 2007         VAGINAL DELIVERY                                                Comment:3 children   BREAST BIOPSY                                   Left 2004           Comment:neg  BMI    Body Mass Index   20.75 kg/m 2      Reproductive/Obstetrics negative OB ROS                             Anesthesia Physical Anesthesia Plan  ASA: IV  Anesthesia Plan: General ETT   Post-op Pain Management:    Induction:   Airway Management Planned:   Additional Equipment:   Intra-op Plan:   Post-operative Plan:   Informed Consent: I have reviewed the patients History and Physical, chart, labs and discussed the procedure including the risks, benefits and alternatives for the proposed anesthesia with the patient or authorized representative who has indicated his/her understanding and acceptance.   Dental Advisory Given  Plan Discussed with: Anesthesiologist, CRNA and Surgeon  Anesthesia Plan Comments: (Patient informed that they are higher risk for complications from anesthesia during this procedure due to their medical history.  Patient voiced understanding. )        Anesthesia Quick Evaluation

## 2015-03-07 NOTE — Transfer of Care (Signed)
Immediate Anesthesia Transfer of Care Note  Patient: Sally Green  Procedure(s) Performed: Procedure(s): ELECTROMAGNETIC NAVIGATION BRONCHOSCOPY (N/A)  Patient Location: PACU  Anesthesia Type:General  Level of Consciousness: awake and patient cooperative  Airway & Oxygen Therapy: Patient Spontanous Breathing  Post-op Assessment: Report given to RN  Post vital signs: Reviewed and stable  Last Vitals:  Filed Vitals:   03/07/15 1218 03/07/15 1456  BP: 126/74 130/61  Pulse: 72 79  Temp: 36.5 C 36.6 C  Resp: 18 23    Complications: No apparent anesthesia complications

## 2015-03-07 NOTE — Anesthesia Procedure Notes (Signed)
Procedure Name: Intubation Date/Time: 03/07/2015 1:36 PM Performed by: Dionne Bucy Pre-anesthesia Checklist: Patient identified, Patient being monitored, Timeout performed, Emergency Drugs available and Suction available Patient Re-evaluated:Patient Re-evaluated prior to inductionOxygen Delivery Method: Circle system utilized Preoxygenation: Pre-oxygenation with 100% oxygen Intubation Type: IV induction Ventilation: Mask ventilation without difficulty Laryngoscope Size: Mac and 3 Grade View: Grade I Tube type: Oral Tube size: 8.5 mm Number of attempts: 1 Airway Equipment and Method: Stylet Placement Confirmation: ETT inserted through vocal cords under direct vision,  positive ETCO2 and breath sounds checked- equal and bilateral Secured at: 21 cm Tube secured with: Tape Dental Injury: Teeth and Oropharynx as per pre-operative assessment

## 2015-03-07 NOTE — Discharge Instructions (Signed)
Flexible Bronchoscopy, Care After Refer to this sheet in the next few weeks. These instructions provide you with information on caring for yourself after your procedure. Your health care provider may also give you more specific instructions. Your treatment has been planned according to current medical practices, but problems sometimes occur. Call your health care provider if you have any problems or questions after your procedure.  WHAT TO EXPECT AFTER THE PROCEDURE It is normal to have the following symptoms for 24-48 hours after the procedure:   Increased cough.  Low-grade fever.  Sore throat or hoarse voice.  Small streaks of blood in your thick spit (sputum) if tissue samples were taken (biopsy). HOME CARE INSTRUCTIONS   Do not eat or drink anything for 2 hours after your procedure. Your nose and throat were numbed by medicine. If you try to eat or drink before the medicine wears off, food or drink could go into your lungs or you could burn yourself. After the numbness is gone and your cough and gag reflexes have returned, you may eat soft food and drink liquids slowly.   The day after the procedure, you can go back to your normal diet.   You may resume normal activities.   Keep all follow-up visits as directed by your health care provider. It is important to keep all your appointments, especially if tissue samples were taken for testing (biopsy). SEEK IMMEDIATE MEDICAL CARE IF:   You have increasing shortness of breath.   You become light-headed or faint.   You have chest pain.   You have any new concerning symptoms.  You cough up more than a small amount of blood.  The amount of blood you cough up increases. MAKE SURE YOU:  Understand these instructions.  Will watch your condition.  Will get help right away if you are not doing well or get worse.   This information is not intended to replace advice given to you by your health care provider. Make sure you discuss  any questions you have with your health care provider.   Document Released: 10/04/2004 Document Revised: 04/07/2014 Document Reviewed: 11/19/2012 Elsevier Interactive Patient Education [employer].

## 2015-03-07 NOTE — H&P (View-Only) (Signed)
[facility] Post-[facility]  Patient ID: MIRENDA BALTAZAR, female   DOB: September 13, 1945, 69 y.o.   MRN: [id number]  HISTORY: Mr. Mione returns today in follow-up. She did have a PET scan done which shows intense uptake in the right upper lobe mass most consistent with a pulmonary malignancy. The patient continues to smoke. She did have some pulmonary function studies done which reveal an FEV1 and DLCO of approximate 50%. The lesion in the right upper lobe is most likely require a right upper lobectomy.   Filed Vitals:   02/15/15 1016  BP: 161/81  Pulse: 87  Temp: 97.9 F (36.6 C)  Resp: 18     EXAM: Resp: Lungs are clear bilaterally.  No respiratory distress, normal effort. Heart:  Regular without murmurs Abd:  Abdomen is soft, non distended and non tender. No masses are palpable.  There is no rebound and no guarding.  Neurological: Alert and oriented to person, place, and time. Coordination normal.  Skin: Skin is warm and dry. No rash noted. No diaphoretic. No erythema. No pallor.  Psychiatric: Normal mood and affect. Normal behavior. Judgment and thought content normal.    ASSESSMENT: I had a long discussion with her and her daughter today. We discussed the options of surgical resection versus radiation therapy. I explained her that at the present time I thought that her pulmonary functions were marginal for pulmonary resection. I did ask her to consider pulmonary rehabilitation with smoking cessation. She would like to think about that. In the interim we'll go ahead and set her up to see Dr. Edwin Dada. Dr. Edwin Dada was performed a bronchoscopy on her daughter and she would like to proceed down that route. Perhaps her something he may do to improve her overall pulmonary function so that surgical intervention may be entertained. In addition we'll ask Dr. Berton Mount to see the patient to give [place] his opinion on whether or not she would be a candidate for radiation therapy.   PLAN:   We will set  her up an appointment with Dr. Edwin Dada and Dr. Donella Stade. She'll come back to see me as needed.    Nestor Lewandowsky, MD

## 2015-03-07 NOTE — Op Note (Signed)
Electromagnetic Navigation Bronchoscopy: Indication: lung mass  Preoperative Diagnosis:RUL mass Post Procedure Diagnosis: RUL lung mass Consent: verbal/written Risks and benefits explained in detail including risk of infection, bleeding, respiratory failure and death.   Hand washing performed prior to starting the procedure.   Type of Anesthesia: see Anesthesiology records .   Procedure Performed:  Virtual Bronchoscopy with Multi-planar Image analysis, 3-D reconstruction of coronal, sagittal and multi-planar images for the purposes of planning real-time bronchoscopy using the iLogic Electromagnetic Navigation Bronchoscopy System (superDimension)..   Description of Procedure: After obtaining informed consent from the patient, the above sedative and anesthetic measures were carried out, flexible fiberoptic bronchoscope was inserted via an oral bite block. Posterior pharynx was clear. The 2 vocal cords were easily traversed after application of local anesthetic.  The virtual camera was then placed into the central portion of the trachea. The trachea itself was inspected.  The main carina, right and left midstem bronchus and all the segmental and subsegmental airways by virtual bronchoscopy were brieftly inspected.  The camera was directed to standard registration points at the following centers: main carina, right upper lobe bronchus, right lower lobe bronchus, right middle lobe bronchus, left upper lobe bronchus, and the left lower lobe bronchus. This data was transferred to the i-Logic ENB system for real-time bronchoscopy. The ENB nav system guided to RUL lung mass  TRANSBRONCHIAL Specimans Obtained:  TRANSBRONCHIAL Fine Needle Aspirations 21G times:2    TRANSBRONCHIALTriple Needle Brush:3  TRANSBRONCHIAL GEN CUT FNA x1  ENDOBRONCHIAL Forceps Biopsies:3  Fluoroscopy:  Fluoroscopy was utilized during the course of this procedure to assure that biopsies were taken in a safe manner under  fluoroscopic guidance with no spot films required.   Complications:none  Estimated Blood Loss: 15 ml  Monitoring:  The patient was monitored with continuous oximetry and received supplemental nasal cannula oxygen throughout the procedure. In addition, serial blood pressure measurements and continuous electrocardiography showed these physiologic parameters to remain tolerable throughout the procedure.   Assessment and Plan/Additional Comments:follow up Path reports    Corrin Parker, M.D.  [facility]  Medical Director [place] Director [facility] Cardio-Pulmonary Department

## 2015-03-07 NOTE — Anesthesia Postprocedure Evaluation (Signed)
Anesthesia Post Note  Patient: Sally Green  Procedure(s) Performed: Procedure(s) (LRB): ELECTROMAGNETIC NAVIGATION BRONCHOSCOPY (N/A)  Patient location during evaluation: PACU Anesthesia Type: General Level of consciousness: awake and alert Pain management: pain level controlled Vital Signs Assessment: post-procedure vital signs reviewed and stable Respiratory status: spontaneous breathing, nonlabored ventilation, respiratory function stable and patient connected to nasal cannula oxygen Cardiovascular status: blood pressure returned to baseline and stable Postop Assessment: no signs of nausea or vomiting Anesthetic complications: no    Last Vitals:  Filed Vitals:   03/07/15 1532 03/07/15 1538  BP:  125/79  Pulse: 65 66  Temp:  36.6 C  Resp: 14 14    Last Pain:  Filed Vitals:   03/07/15 1543  PainSc: 2                  Precious Haws Piscitello

## 2015-03-08 ENCOUNTER — Encounter: Payer: Self-pay | Admitting: Internal Medicine

## 2015-03-09 ENCOUNTER — Telehealth: Payer: Self-pay

## 2015-03-09 LAB — CYTOLOGY - NON PAP

## 2015-03-09 LAB — SURGICAL PATHOLOGY

## 2015-03-09 NOTE — Telephone Encounter (Signed)
Spoke with Dr. Luana Shu at this time. She gave results or pathology from Right Upper Lobe as Squamous Cell Carcinoma. Call made to [facility] for nurse to call back so that result can be given to ordering physician, Dr. Mortimer Fries.

## 2015-03-12 NOTE — Telephone Encounter (Signed)
Dr. Zoila Shutter nurse returned phone call this morning. Will get patient in to see Dr. Mortimer Fries in near future and would like to make Dr. Genevive Bi aware.  Called Dr. Genevive Bi.Marland Kitchen He would like to see patient in clinic on Thursday (12/15).  [place], spoke with [place]. She states that she will make sure patient is placed on Dr. Genevive Bi schedule for Thursday

## 2015-03-14 ENCOUNTER — Ambulatory Visit
Admission: RE | Admit: 2015-03-14 | Discharge: 2015-03-14 | Disposition: A | Discharge status: Home / Self Care | Payer: Medicare Other | Source: Ambulatory Visit | Attending: Radiation Oncology | Admitting: Radiation Oncology

## 2015-03-14 ENCOUNTER — Encounter: Payer: Self-pay | Admitting: Radiation Oncology

## 2015-03-14 VITALS — BP 158/70 | HR 74 | Temp 98.2°F | Resp 20 | Wt 122.9 lb

## 2015-03-14 DIAGNOSIS — C3411 Malignant neoplasm of upper lobe, right bronchus or lung: Secondary | ICD-10-CM

## 2015-03-14 DIAGNOSIS — Z87891 Personal history of nicotine dependence: Secondary | ICD-10-CM | POA: Insufficient documentation

## 2015-03-14 DIAGNOSIS — Z510 Encounter for antineoplastic radiation therapy: Secondary | ICD-10-CM | POA: Insufficient documentation

## 2015-03-14 NOTE — Consult Note (Signed)
Except an outstanding is perfect of Radiation Oncology NEW PATIENT EVALUATION  Name: Sally Green  MRN: [id number]  Date:   03/14/2015     DOB: 14-May-1945   This 69 y.o. 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  REFERRING PHYSICIAN: Adrian Prows, MD  CHIEF COMPLAINT:  Chief Complaint  Patient presents with  . Lung Cancer    Pt is here for initial consultation of lung cancer.     DIAGNOSIS: The encounter diagnosis was Malignant neoplasm of right upper lobe of lung ([place]).   PREVIOUS INVESTIGATIONS:  PET CT and CT scans reviewed Surgical pathology report reviewed Clinical notes reviewed  HPI: Patient is a 69 year old female mother of my former patients who is been followed by thoracic surgery for over a year. She has an asymptomatic right upper lobe small hypermetabolic nodule that recently was biopsied with electromagnetic navigational bronchoscopy and was positive for squamous cell carcinoma. She also has a lesion in her left lower lobe non-hypermetabolic consistent with a hamartoma. She is asymptomatic specifically denies cough hemoptysis or chest tightness by mouth intake is good weight is stable. I been asked to evaluate the patient for possible treatment options since she has an FEV1 of 50% of predicted thought not to be a surgical candidate and is referred for radiation therapy opinion.  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    PAST SURGICAL HISTORY:  Past Surgical History  Procedure Laterality Date  . Shoulder surgery Left 2007  . Vaginal  delivery      3 children  . Breast biopsy Left 2004    neg  . Electromagnetic navigation brochoscopy N/A 03/07/2015    Procedure: ELECTROMAGNETIC NAVIGATION BRONCHOSCOPY;  Surgeon: Flora Lipps, MD;  Location: [facility] ORS;  Service: Cardiopulmonary;  Laterality: N/A;    FAMILY HISTORY: family history includes Alzheimer's disease in her father; Aneurysm in her father and paternal uncle; Breast cancer in her maternal aunt; Cancer in her maternal aunt, other, paternal aunt, and sister; Headache in her father and sister; Leukemia in her maternal uncle; Lymphoma in her mother.  SOCIAL HISTORY:  reports that she quit smoking about 20 months ago. Her smoking use included Cigarettes. She has a 62.5 pack-year smoking history. She has never used smokeless tobacco. She reports that she does not drink alcohol or use illicit drugs.  ALLERGIES: Depakote; Aspirin; Keppra; Topamax; and Guaifenesin  MEDICATIONS:  Current Outpatient Prescriptions  Medication Sig Dispense Refill  . acetaminophen (TYLENOL) 325 MG tablet Take 650 mg by mouth every 6 (six) hours as needed.    Marland Kitchen alendronate (FOSAMAX) 70 MG tablet Take 70 mg by mouth every 7 (seven) days.  0  . atorvastatin (LIPITOR) 40 MG tablet Take 1 tablet by mouth daily at 6 PM.   1  . Calcium Carb-Cholecalciferol (CALCIUM + D3 PO) Take 1 tablet by mouth 2 (two) times daily.    Marland Kitchen lamoTRIgine (LAMICTAL) 100 MG tablet Take two and one half in the morning and take three in late afternoon 495 tablet 3  . lamoTRIgine (LAMICTAL) 100 MG tablet Take two and one half in the morning and take three in late afternoon    . Oral  Electrolytes (BUFFERED SALT PO) Take 1 tablet by mouth once a week.    . phenytoin (DILANTIN) 100 MG ER capsule Take 3 capsules (300 mg total) by mouth at bedtime. 270 capsule 3  . sertraline (ZOLOFT) 50 MG tablet Take 1 tablet (50 mg total) by mouth daily. (Patient taking differently: Take 50 mg by mouth at bedtime. ) 30 tablet 5   No current  facility-administered medications for this encounter.    ECOG PERFORMANCE STATUS:  0 - Asymptomatic  REVIEW OF SYSTEMS:  Patient denies any weight loss, fatigue, weakness, fever, chills or night sweats. Patient denies any loss of vision, blurred vision. Patient denies any ringing  of the ears or hearing loss. No irregular heartbeat. Patient denies heart murmur or history of fainting. Patient denies any chest pain or pain radiating to her upper extremities. Patient denies any shortness of breath, difficulty breathing at night, cough or hemoptysis. Patient denies any swelling in the lower legs. Patient denies any nausea vomiting, vomiting of blood, or coffee ground material in the vomitus. Patient denies any stomach pain. Patient states has had normal bowel movements no significant constipation or diarrhea. Patient denies any dysuria, hematuria or significant nocturia. Patient denies any problems walking, swelling in the joints or loss of balance. Patient denies any skin changes, loss of hair or loss of weight. Patient denies any excessive worrying or anxiety or significant depression. Patient denies any problems with insomnia. Patient denies excessive thirst, polyuria, polydipsia. Patient denies any swollen glands, patient denies easy bruising or easy bleeding. Patient denies any recent infections, allergies or URI. Patient "s visual fields have not changed significantly in recent time.    PHYSICAL EXAM: BP 158/70 mmHg  Pulse 74  Temp(Src) 98.2 F (36.8 C)  Resp 20  Wt 122 lb 14.5 oz (55.75 kg) Well-developed well-nourished patient in NAD. HEENT reveals PERLA, EOMI, discs not visualized.  Oral cavity is clear. No oral mucosal lesions are identified. Neck is clear without evidence of cervical or supraclavicular adenopathy. Lungs are clear to A&P. Cardiac examination is essentially unremarkable with regular rate and rhythm without murmur rub or thrill. Abdomen is benign with no organomegaly or masses  noted. Motor sensory and DTR levels are equal and symmetric in the upper and lower extremities. Cranial nerves II through XII are grossly intact. Proprioception is intact. No peripheral adenopathy or edema is identified. No motor or sensory levels are noted. Crude visual fields are within normal range.  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 Would plan on delivering 5000 cGy in 5 fractions to her right upper lobe lesion. Will use for D treatment planning as well as restrictive breathing apparatus on her abdomen during her both treatment planning and treatments. Risks and benefits of treatment including fatigue, possible slight alteration of blood counts, dysphasia, and possible chronic cough all were discussed in detail with the patient and her daughters. I have set up and ordered CT simulation.  I would like to take this opportunity for allowing me to participate in the care of your patient.Armstead Peaks., MD

## 2015-03-28 ENCOUNTER — Ambulatory Visit
Admission: RE | Admit: 2015-03-28 | Discharge: 2015-03-28 | Disposition: A | Discharge status: Home / Self Care | Payer: Medicare Other | Source: Ambulatory Visit | Attending: Radiation Oncology | Admitting: Radiation Oncology

## 2015-03-28 DIAGNOSIS — Z510 Encounter for antineoplastic radiation therapy: Secondary | ICD-10-CM | POA: Diagnosis present

## 2015-03-28 DIAGNOSIS — Z87891 Personal history of nicotine dependence: Secondary | ICD-10-CM | POA: Diagnosis not present

## 2015-03-28 DIAGNOSIS — C3411 Malignant neoplasm of upper lobe, right bronchus or lung: Secondary | ICD-10-CM | POA: Diagnosis not present

## 2015-04-01 DIAGNOSIS — C801 Malignant (primary) neoplasm, unspecified: Secondary | ICD-10-CM

## 2015-04-01 HISTORY — DX: Malignant (primary) neoplasm, unspecified: C80.1

## 2015-04-03 DIAGNOSIS — C3411 Malignant neoplasm of upper lobe, right bronchus or lung: Secondary | ICD-10-CM | POA: Diagnosis not present

## 2015-04-03 DIAGNOSIS — Z87891 Personal history of nicotine dependence: Secondary | ICD-10-CM | POA: Diagnosis not present

## 2015-04-03 DIAGNOSIS — Z510 Encounter for antineoplastic radiation therapy: Secondary | ICD-10-CM | POA: Diagnosis present

## 2015-04-09 ENCOUNTER — Ambulatory Visit: Payer: Medicare Other | Admitting: Radiation Oncology

## 2015-04-10 ENCOUNTER — Ambulatory Visit
Admission: RE | Admit: 2015-04-10 | Discharge: 2015-04-10 | Disposition: A | Discharge status: Home / Self Care | Payer: Medicare Other | Source: Ambulatory Visit | Attending: Radiation Oncology | Admitting: Radiation Oncology

## 2015-04-10 DIAGNOSIS — Z510 Encounter for antineoplastic radiation therapy: Secondary | ICD-10-CM | POA: Diagnosis not present

## 2015-04-11 ENCOUNTER — Ambulatory Visit: Payer: Medicare Other | Admitting: Radiation Oncology

## 2015-04-12 ENCOUNTER — Ambulatory Visit
Admission: RE | Admit: 2015-04-12 | Discharge: 2015-04-12 | Disposition: A | Discharge status: Home / Self Care | Payer: Medicare Other | Source: Ambulatory Visit | Attending: Radiation Oncology | Admitting: Radiation Oncology

## 2015-04-12 DIAGNOSIS — Z510 Encounter for antineoplastic radiation therapy: Secondary | ICD-10-CM | POA: Diagnosis not present

## 2015-04-16 ENCOUNTER — Ambulatory Visit
Admission: RE | Admit: 2015-04-16 | Discharge: 2015-04-16 | Disposition: A | Discharge status: Home / Self Care | Payer: Medicare Other | Source: Ambulatory Visit | Attending: Radiation Oncology | Admitting: Radiation Oncology

## 2015-04-16 DIAGNOSIS — Z510 Encounter for antineoplastic radiation therapy: Secondary | ICD-10-CM | POA: Diagnosis not present

## 2015-04-18 ENCOUNTER — Ambulatory Visit
Admission: RE | Admit: 2015-04-18 | Discharge: 2015-04-18 | Disposition: A | Discharge status: Home / Self Care | Payer: Medicare Other | Source: Ambulatory Visit | Attending: Radiation Oncology | Admitting: Radiation Oncology

## 2015-04-18 DIAGNOSIS — Z510 Encounter for antineoplastic radiation therapy: Secondary | ICD-10-CM | POA: Diagnosis not present

## 2015-04-23 ENCOUNTER — Ambulatory Visit
Admission: RE | Admit: 2015-04-23 | Discharge: 2015-04-23 | Disposition: A | Discharge status: Home / Self Care | Payer: Medicare Other | Source: Ambulatory Visit | Attending: Radiation Oncology | Admitting: Radiation Oncology

## 2015-04-23 DIAGNOSIS — Z510 Encounter for antineoplastic radiation therapy: Secondary | ICD-10-CM | POA: Diagnosis not present

## 2015-05-24 ENCOUNTER — Encounter: Payer: Self-pay | Admitting: Radiation Oncology

## 2015-05-24 ENCOUNTER — Other Ambulatory Visit: Payer: Self-pay | Admitting: *Deleted

## 2015-05-24 ENCOUNTER — Ambulatory Visit
Admission: RE | Admit: 2015-05-24 | Discharge: 2015-05-24 | Disposition: A | Discharge status: Home / Self Care | Payer: Medicare Other | Source: Ambulatory Visit | Attending: Radiation Oncology | Admitting: Radiation Oncology

## 2015-05-24 VITALS — BP 159/66 | HR 81 | Temp 97.0°F | Resp 21 | Wt 127.3 lb

## 2015-05-24 DIAGNOSIS — C3411 Malignant neoplasm of upper lobe, right bronchus or lung: Secondary | ICD-10-CM

## 2015-05-24 NOTE — Progress Notes (Signed)
Radiation Oncology Follow up Note  Name: Sally Green   Date:   05/24/2015 MRN:  [id number] DOB: 04/29/1945    This 70 y.o. female presents to the clinic today for follow-up for SB RT to her right upper lobe for stage I squamous cell carcinoma.  REFERRING PROVIDER: Adrian Prows, MD  HPI: Patient is a 70 year old female now 1 month out having completed SB RT treatment to her right upper lobe for stage I squamous cell carcinoma. She is seen today one month out and is doing well. She specifically denies cough hemoptysis chest tightness or any change in her pulmonary status. She continues to have good by mouth intake with no dysphagia..  COMPLICATIONS OF TREATMENT: none  FOLLOW UP COMPLIANCE: keeps appointments   PHYSICAL EXAM:  BP 159/66 mmHg  Pulse 81  Temp(Src) 97 F (36.1 C)  Resp 21  Wt 127 lb 5.1 oz (57.75 kg) Well-developed well-nourished patient in NAD. HEENT reveals PERLA, EOMI, discs not visualized.  Oral cavity is clear. No oral mucosal lesions are identified. Neck is clear without evidence of cervical or supraclavicular adenopathy. Lungs are clear to A&P. Cardiac examination is essentially unremarkable with regular rate and rhythm without murmur rub or thrill. Abdomen is benign with no organomegaly or masses noted. Motor sensory and DTR levels are equal and symmetric in the upper and lower extremities. Cranial nerves II through XII are grossly intact. Proprioception is intact. No peripheral adenopathy or edema is identified. No motor or sensory levels are noted. Crude visual fields are within normal range.  RADIOLOGY RESULTS: Repeat CT scan in 2 and half months has been ordered  PLAN: At this time patient continues to do well asymptomatic after SB RT 1 month prior to her right upper lobe. I've ordered a CT scan with contrast of her chest in about 2 months and set up a follow-up appointment shortly thereafter to review that with the patient. I am please were overall  progress. Patient knows to call sooner with any concerns.  I would like to take this opportunity for allowing me to participate in the care of your patient.Armstead Peaks., MD

## 2015-07-19 ENCOUNTER — Encounter: Payer: Self-pay | Admitting: Adult Health

## 2015-07-19 ENCOUNTER — Ambulatory Visit (INDEPENDENT_AMBULATORY_CARE_PROVIDER_SITE_OTHER): Payer: Medicare Other | Admitting: Adult Health

## 2015-07-19 VITALS — BP 165/74 | HR 69 | Ht 64.0 in

## 2015-07-19 DIAGNOSIS — R4701 Aphasia: Secondary | ICD-10-CM

## 2015-07-19 DIAGNOSIS — Z5181 Encounter for therapeutic drug level monitoring: Secondary | ICD-10-CM

## 2015-07-19 DIAGNOSIS — R569 Unspecified convulsions: Secondary | ICD-10-CM

## 2015-07-19 MED ORDER — PHENYTOIN SODIUM EXTENDED 100 MG PO CAPS: 300.0000 mg | ORAL_CAPSULE | Freq: Every day | ORAL | Status: DC

## 2015-07-19 MED ORDER — LAMOTRIGINE 100 MG PO TABS: ORAL_TABLET | ORAL | Status: DC

## 2015-07-19 NOTE — Progress Notes (Signed)
PATIENT: Sally Green DOB: 1946/01/26  REASON FOR VISIT: follow up- seizures HISTORY FROM: patient  HISTORY OF PRESENT ILLNESS: Sally Green is a 70 year old female with a history of AVM and sensory type seizures. She returns today for follow-up. She continues to take Dilantin 300 mg at bedtime and Lamictal 100 mg 2.5 Tablets in the morning and 3 tablets in the evening. She reports that in November she only had 1 seizure. In December, February, March and April she had 2 seizures. She did not have any seizure events in January. Her seizures still consist of a tingling sensation that starts in the head and travels down the middle of the body. This only lasts for a couple of seconds. Afterward she has a hard time speaking. She states that she knows the words that she wants to say but is unable to pronounce them. This will last for approximately one and a half minutes. She denies any loss of consciousness. Denies any convulsing in the extremities. She does live at home alone. She is able to complete all ADLs independently. She operates a Teacher, music without difficulty. In the past she has not wanted to add on or increase her medication. She returns today for an evaluation.  HISTORY 01/18/15 (MM): Sally Green is a 69 year old female with a history of AVM and sensory type seizures. She returns today for follow-up. She is currently taken Dilantin 300 mg at bedtime and Lamictal 100 mg 2 tablets in the morning and 3 tablets in the evening. She reports that she continues to have seizure episodes. She states that her seizures consist of a warm and tingling sensation that travels down the body not on one particular side of the body. She then will have loss of speech. She states this can last from anywhere from 1 minute to 1-1/2 minutes. She states that she cannot always tell when the episodes are coming on. She does not have any additional symptoms with these episodes. 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 She states that she tried this a week but did not notice the benefits so she stopped the medication. She states that she has a bone density scan today. Denies any significant changes with her gait or balance. She returns today for evaluation.  HISTORY 08/17/14 [facility]):  The patient was diagnosed with an AV in 2008 by Dr. Claiborne Billings , Dr Mable Fill in [place].  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 She has been followed here at [facility] and parallel by Dr. Tommi Rumps at [facility].  CD 08-17-14 for this long-standing patient of mine. She endorsed today the Epworth sleepiness score at 5 points fatigue severity at 9 points and the depression scale geriatric depression scale at 1-2 points. The latter is remarkable because Sally Green lost her husband about 6 months ago. She now lives alone which has been burdensome to her and she is also still not used to sleeping and living alone in a house she shared with her husband. She has family around and her grandson lives close by.  She reports her last seizure activity on a the third at about 7 AM she handed me a list of all seizures she has had in 2016. [id number] at 7:45 PM for about 1 minute generally 28th 3:30 PM 1 minutes Fabry 10 7:30 PM 1-1/2-2 minutes for every 21st 5:50 AM 1 minute April 9 6:30 AM  1 minute April 23 19 p.m. 1 minute May 3 1 minute 7 AM. Most seizures ( tingling , speech arrest ) happen either right in the morning after she rises and around 8: PM. The medication has been unchanged, she states that she sleeps rather poorly. I would not be opposed to use a sleep aid for her as she has not had success with trying melatonin. Aware that sleep deprivation is also a seizure trigger. He has not been on antidepressants which may help her with her grieving . Her daughter was diagnosed at age 91 with lung cancer of the left  lung , and is undergoing chemotherapy . She has her third treatment tomorrow. This also worries her mother of course.  REVIEW OF SYSTEMS: Out of a complete 14 system review of symptoms, the patient complains only of the following symptoms, and all other reviewed systems are negative.  Double vision, seizure, speech difficulty  ALLERGIES: Allergies  Allergen Reactions  . Depakote [Divalproex Sodium]   . Aspirin   . Keppra [Levetiracetam]   . Topamax [Topiramate]   . Guaifenesin Palpitations    HOME MEDICATIONS: Outpatient Prescriptions Prior to Visit  Medication Sig Dispense Refill  . acetaminophen (TYLENOL) 325 MG tablet Take 650 mg by mouth every 6 (six) hours as needed.    Marland Kitchen alendronate (FOSAMAX) 70 MG tablet Take 70 mg by mouth every 7 (seven) days.  0  . atorvastatin (LIPITOR) 40 MG tablet Take 1 tablet by mouth daily at 6 PM.   1  . Oral Electrolytes (BUFFERED SALT PO) Take 1 tablet by mouth once a week.    . lamoTRIgine (LAMICTAL) 100 MG tablet Take two and one half in the morning and take three in late afternoon 495 tablet 3  . phenytoin (DILANTIN) 100 MG ER capsule Take 3 capsules (300 mg total) by mouth at bedtime. 270 capsule 3  . Calcium Carb-Cholecalciferol (CALCIUM + D3 PO) Take 1 tablet by mouth 2 (two) times daily.    Marland Kitchen lamoTRIgine (LAMICTAL) 100 MG tablet Take two and one half in the morning and take three in late afternoon    . sertraline (ZOLOFT) 50 MG tablet Take 1 tablet (50 mg total) by mouth daily. (Patient not taking: Reported on 07/19/2015) 30 tablet 5   No facility-administered medications prior to visit.    PAST MEDICAL HISTORY: Past Medical History  Diagnosis Date  . Hypersomnia   . 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 Hyperlipemia   . Seizures ([place])   . 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  Sees dr Tommi Rumps at [facility]  Oct 2014.    Marland Kitchen AVM (arteriovenous malformation)   . Depression   . Fracture, foot     Right   . Lung mass     PAST SURGICAL HISTORY: Past Surgical History  Procedure Laterality Date  . Shoulder surgery Left 2007  . Vaginal delivery      3 children  . Breast biopsy Left 2004    neg  . Electromagnetic navigation brochoscopy N/A 03/07/2015    Procedure: ELECTROMAGNETIC NAVIGATION BRONCHOSCOPY;  Surgeon: Flora Lipps, MD;  Location: [facility] ORS;  Service: Cardiopulmonary;  Laterality: N/A;    FAMILY HISTORY: Family History  Problem Relation Age of Onset  . Lymphoma Mother   . Alzheimer's disease Father   . Aneurysm Father   . Headache  Father   . Headache Sister   . Cancer Sister     lung  . Cancer Other   . Aneurysm Paternal Uncle   . Cancer Maternal Aunt   . Breast cancer Maternal Aunt   . Cancer Paternal Aunt   . Leukemia Maternal Uncle     SOCIAL HISTORY: Social History   Social History  . Marital Status: Married    Spouse Name: Wille Glaser  . Number of Children: 3  . Years of Education: 12   Occupational History  . retired    Social History Main Topics  . Smoking status: Former Smoker -- 1.25 packs/day for 50 years    Types: Cigarettes    Quit date: 06/24/2013  . Smokeless tobacco: Never Used  . Alcohol Use: No  . Drug Use: No  . Sexual Activity: Not on file   Other Topics Concern  . Not on file   Social History Narrative   Patient is married ([name]) and lives at home with her husband.   Patient has three children.   Patient is retired.   Patient has a high school education.   Patient is right-handed.   Consumes caffeine rarely.      PHYSICAL EXAM  Filed Vitals:   07/19/15 1253  BP: 165/74  Pulse: 69  Height: '5\' 4"'$  (1.626 m)   There is no weight on file to calculate BMI.  Generalized: Well developed, in no acute distress   Neurological examination  Mentation: Alert oriented to time, place, history taking.  Follows all commands speech and language fluent Cranial nerve II-XII: Pupils were equal round reactive to light. Extraocular movements were full, visual field were full on confrontational test. Facial sensation and strength were normal. Uvula tongue midline. Head turning and shoulder shrug  were normal and symmetric. Motor: The motor testing reveals 5 over 5 strength of all 4 extremities. Good symmetric motor tone is noted throughout.  Sensory: Sensory testing is intact to soft touch on all 4 extremities. No evidence of extinction is noted.  Coordination: Cerebellar testing reveals good finger-nose-finger and heel-to-shin bilaterally.  Gait and station: Gait is normal. Tandem gait is normal. Romberg is negative. No drift is seen.  Reflexes: Deep tendon reflexes are symmetric and normal bilaterally.   DIAGNOSTIC DATA (LABS, IMAGING, TESTING) - I reviewed patient records, labs, notes, testing and imaging myself where available.  Lab Results  Component Value Date   WBC 5.8 01/18/2015   HGB 14.8 02/16/2014   HCT 43.9 01/18/2015   MCV 93 01/18/2015   PLT 235 01/18/2015      Component Value Date/Time   NA 139 02/27/2015 1029   NA 142 01/18/2015 0857   NA 143 06/09/2013 1013   K 4.1 02/27/2015 1029   K 3.5 06/09/2013 1013   CL 102 02/27/2015 1029   CL 104 06/09/2013 1013   CO2 31 02/27/2015 1029   CO2 32 06/09/2013 1013   GLUCOSE 91 02/27/2015 1029   GLUCOSE 91 01/18/2015 0857   GLUCOSE 72 06/09/2013 1013   BUN 14 02/27/2015 1029   BUN 9 01/18/2015 0857   BUN 12 06/09/2013 1013   CREATININE 0.58 02/27/2015 1029   CREATININE 0.72 06/09/2013 1013   CALCIUM 9.5 02/27/2015 1029   CALCIUM 8.8 06/09/2013 1013   PROT 6.5 01/18/2015 0857   PROT 6.8 06/09/2013 1013   ALBUMIN 4.1 01/18/2015 0857   ALBUMIN 3.3* 06/09/2013 1013   AST 16 01/18/2015 0857   AST 17 06/09/2013 1013   ALT 11  01/18/2015 0857   ALT 16 06/09/2013 1013   ALKPHOS 109 01/18/2015 0857   ALKPHOS 109 06/09/2013 1013     BILITOT 0.2 01/18/2015 0857   BILITOT 0.3 02/16/2014 1539   BILITOT 0.3 06/09/2013 1013   GFRNONAA >60 02/27/2015 1029   GFRNONAA >60 06/09/2013 1013   GFRAA >60 02/27/2015 1029   GFRAA >60 06/09/2013 1013      ASSESSMENT AND PLAN 70 y.o. 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 here with:  1. Seizures  Overall the patient is doing well. She will continue on Dilantin 300 mg at bedtime and Lamictal 100 mg 2.5 tablets in the morning and 3 tablets in the evening. I will check blood work today. She's been advised that if her symptoms worsen or she develops any new symptoms she should  let us know. She will follow-up in 6 months or sooner if needed.     Ward Givens, MSN, NP-C 07/19/2015, 1:34 PM [facility] [address] [place], [place] [address] [phone]

## 2015-07-19 NOTE — Patient Instructions (Signed)
Continue Lamictal and Dilantin  If your symptoms worsen or you develop new symptoms please let us know.

## 2015-07-20 ENCOUNTER — Telehealth: Payer: Self-pay | Admitting: Adult Health

## 2015-07-20 MED ORDER — PHENYTOIN SODIUM EXTENDED 100 MG PO CAPS: 200.0000 mg | ORAL_CAPSULE | Freq: Every day | ORAL | Status: DC

## 2015-07-20 MED ORDER — PHENYTOIN 50 MG PO CHEW: 50.0000 mg | CHEWABLE_TABLET | Freq: Every day | ORAL | Status: DC

## 2015-07-20 NOTE — Telephone Encounter (Signed)
I called the patient. Her Dilantin level was slightly elevated. This was a trough level. I will decrease her Dilantin to 250 mg at bedtime. We will recheck a Dilantin level in 3 weeks. We are still waiting for her Lamictal level.

## 2015-07-20 NOTE — Progress Notes (Signed)
I agree with the assessment and plan as directed by NP .The patient is known to me .   Sharryn Belding, MD  

## 2015-07-22 LAB — COMPREHENSIVE METABOLIC PANEL
ALBUMIN: 4.2 g/dL (ref 3.5–4.8)
ALK PHOS: 116 IU/L (ref 39–117)
ALT: 14 IU/L (ref 0–32)
AST: 20 IU/L (ref 0–40)
Albumin/Globulin Ratio: 1.6 (ref 1.2–2.2)
BUN / CREAT RATIO: 15 (ref 12–28)
BUN: 9 mg/dL (ref 8–27)
CHLORIDE: 100 mmol/L (ref 96–106)
CO2: 27 mmol/L (ref 18–29)
Calcium: 9.9 mg/dL (ref 8.7–10.3)
Creatinine, Ser: 0.61 mg/dL (ref 0.57–1.00)
GFR calc Af Amer: 106 mL/min/{1.73_m2} (ref >59)
GFR calc non Af Amer: 92 mL/min/{1.73_m2} (ref >59)
GLUCOSE: 82 mg/dL (ref 65–99)
Globulin, Total: 2.6 g/dL (ref 1.5–4.5)
Potassium: 5 mmol/L (ref 3.5–5.2)
Sodium: 144 mmol/L (ref 134–144)
Total Protein: 6.8 g/dL (ref 6.0–8.5)

## 2015-07-22 LAB — CBC WITH DIFFERENTIAL/PLATELET
BASOS ABS: 0 10*3/uL (ref 0.0–0.2)
Basos: 1 %
EOS (ABSOLUTE): 0.1 10*3/uL (ref 0.0–0.4)
Eos: 2 %
HEMOGLOBIN: 14.9 g/dL (ref 11.1–15.9)
Hematocrit: 42.7 % (ref 34.0–46.6)
Immature Grans (Abs): 0 10*3/uL (ref 0.0–0.1)
Immature Granulocytes: 0 %
LYMPHS ABS: 2.1 10*3/uL (ref 0.7–3.1)
Lymphs: 37 %
MCH: 31.2 pg (ref 26.6–33.0)
MCHC: 34.9 g/dL (ref 31.5–35.7)
MCV: 89 fL (ref 79–97)
MONOCYTES: 8 %
MONOS ABS: 0.5 10*3/uL (ref 0.1–0.9)
Neutrophils Absolute: 2.8 10*3/uL (ref 1.4–7.0)
Neutrophils: 52 %
Platelets: 240 10*3/uL (ref 150–379)
RBC: 4.78 x10E6/uL (ref 3.77–5.28)
RDW: 13.9 % (ref 12.3–15.4)
WBC: 5.5 10*3/uL (ref 3.4–10.8)

## 2015-07-22 LAB — LAMOTRIGINE LEVEL: Lamotrigine Lvl: 4.2 ug/mL (ref 2.0–20.0)

## 2015-07-22 LAB — PHENYTOIN LEVEL, TOTAL: Phenytoin (Dilantin), Serum: 22.3 ug/mL (ref 10.0–20.0)

## 2015-07-23 ENCOUNTER — Telehealth: Payer: Self-pay | Admitting: Adult Health

## 2015-07-23 NOTE — Telephone Encounter (Signed)
-----   Message from Ward Givens, NP sent at 07/23/2015  8:00 AM EDT ----- lamictal level ok. Please let patient know.

## 2015-07-23 NOTE — Telephone Encounter (Signed)
Called and spoke to patient and relayed Lamictal level was good. Patient relayed ok and she understood.

## 2015-07-23 NOTE — Progress Notes (Signed)
Quick Note:  Called and spoke to patient relayed Lamictal level was ok. Patient understood details. ______

## 2015-08-08 ENCOUNTER — Ambulatory Visit
Admission: RE | Admit: 2015-08-08 | Discharge: 2015-08-08 | Disposition: A | Discharge status: Home / Self Care | Payer: Medicare Other | Source: Ambulatory Visit | Attending: Radiation Oncology | Admitting: Radiation Oncology

## 2015-08-08 DIAGNOSIS — K7460 Unspecified cirrhosis of liver: Secondary | ICD-10-CM | POA: Insufficient documentation

## 2015-08-08 DIAGNOSIS — C3411 Malignant neoplasm of upper lobe, right bronchus or lung: Secondary | ICD-10-CM | POA: Diagnosis present

## 2015-08-08 DIAGNOSIS — R918 Other nonspecific abnormal finding of lung field: Secondary | ICD-10-CM | POA: Insufficient documentation

## 2015-08-08 DIAGNOSIS — I700 Atherosclerosis of aorta: Secondary | ICD-10-CM | POA: Insufficient documentation

## 2015-08-08 HISTORY — DX: Malignant (primary) neoplasm, unspecified: C80.1

## 2015-08-08 MED ORDER — IOPAMIDOL (ISOVUE-300) INJECTION 61%
75.0000 mL | Freq: Once | INTRAVENOUS | Status: AC | PRN
  Administered 2015-08-08: 75 mL via INTRAVENOUS

## 2015-08-16 ENCOUNTER — Telehealth: Payer: Self-pay | Admitting: *Deleted

## 2015-08-16 MED ORDER — PHENYTOIN 50 MG PO CHEW: 50.0000 mg | CHEWABLE_TABLET | Freq: Every day | ORAL | Status: DC

## 2015-08-16 NOTE — Telephone Encounter (Signed)
------------------------------------------------------------   Caller Sally Green              CID [id number]  Patient SAME                 Pt's Dr [facility]      Area Code 336 Phone# [phone] * DOB September 01, 2045    RE HAS QUESTIONS REGARDING HER MEDICATION            CELL [phone]                                  Disp:Y/N N If Y = C/B If No Response In 43mnutes ============================================================

## 2015-08-16 NOTE — Telephone Encounter (Signed)
Spoke with patient who requested her prescription for Dilantin 50 mg tabs be changed to 90 day supply.  Will resend to [facility] at her request.

## 2015-08-16 NOTE — Telephone Encounter (Signed)
Faxed RX to [facility]. Received a receipt of confirmation.

## 2015-08-16 NOTE — Telephone Encounter (Signed)
I called and spoke to pt.  Her last seizure was 3 wks ago.  She is asking to get 90 day supply of of the dilantin infatabs '50mg'$  chewable tabs.   I told her would let MM/NP know about the seizure, and if changes or not, can call in this 90 day on Monday.  She has enough to get through next Saturday.  She also asked for 90 day supply of dilantin '100mg'$  ER tabs.

## 2015-08-20 ENCOUNTER — Telehealth: Payer: Self-pay | Admitting: Adult Health

## 2015-08-20 ENCOUNTER — Other Ambulatory Visit: Payer: Self-pay | Admitting: Adult Health

## 2015-08-20 DIAGNOSIS — Z5181 Encounter for therapeutic drug level monitoring: Secondary | ICD-10-CM

## 2015-08-20 NOTE — Telephone Encounter (Signed)
-----   Message from Ward Givens, NP sent at 07/20/2015 12:41 PM EDT ----- Dilantin

## 2015-08-20 NOTE — Telephone Encounter (Signed)
Patient needs to come in for bloodwork. Dilantin needs to be rechecked.

## 2015-08-21 NOTE — Telephone Encounter (Signed)
I spoke to pt. She will come in and get her dilantin (trough) level drawn possibly this week or next Tuesday.

## 2015-08-22 ENCOUNTER — Encounter: Payer: Self-pay | Admitting: Radiation Oncology

## 2015-08-22 ENCOUNTER — Ambulatory Visit
Admission: RE | Admit: 2015-08-22 | Discharge: 2015-08-22 | Disposition: A | Discharge status: Home / Self Care | Payer: Medicare Other | Source: Ambulatory Visit | Attending: Radiation Oncology | Admitting: Radiation Oncology

## 2015-08-22 ENCOUNTER — Other Ambulatory Visit: Payer: Self-pay | Admitting: *Deleted

## 2015-08-22 VITALS — BP 176/70 | HR 83 | Temp 98.4°F | Resp 20 | Ht 64.0 in | Wt 127.2 lb

## 2015-08-22 DIAGNOSIS — C3411 Malignant neoplasm of upper lobe, right bronchus or lung: Secondary | ICD-10-CM

## 2015-08-22 NOTE — Progress Notes (Signed)
Radiation Oncology Follow up Note  Name: Sally Green   Date:   08/22/2015 MRN:  [id number] DOB: 01/23/1946    This 70 y.o. female presents to the clinic today for follow-up status post SB RT of her right upper lobe now out 4 months.  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 Seen today in routine follow-up she is doing well. She is without complaint. She specifically denies any dyspnea on exertion cough or chest pain.. She recently had a CT scan showing interval decrease size and right upper lobe pulmonary lesion rest of the chest is stable.  COMPLICATIONS OF TREATMENT: none  FOLLOW UP COMPLIANCE: keeps appointments   PHYSICAL EXAM:  BP 176/70 mmHg  Pulse 83  Temp(Src) 98.4 F (36.9 C)  Resp 20  Ht '5\' 4"'$  (1.626 m)  Wt 127 lb 3.3 oz (57.7 kg)  BMI 21.82 kg/m2 Well-developed well-nourished patient in NAD. HEENT reveals PERLA, EOMI, discs not visualized.  Oral cavity is clear. No oral mucosal lesions are identified. Neck is clear without evidence of cervical or supraclavicular adenopathy. Lungs are clear to A&P. Cardiac examination is essentially unremarkable with regular rate and rhythm without murmur rub or thrill. Abdomen is benign with no organomegaly or masses noted. Motor sensory and DTR levels are equal and symmetric in the upper and lower extremities. Cranial nerves II through XII are grossly intact. Proprioception is intact. No peripheral adenopathy or edema is identified. No motor or sensory levels are noted. Crude visual fields are within normal range.  RADIOLOGY RESULTS: Serial CT scans are reviewed  PLAN: The present time patient is doing well with excellent result by CT criteria. I'm please were overall progress. I've asked to see her back in 6 months will do CT scan right before her visit. Patient knows to call sooner with any  concerns.  I would like to take this opportunity to thank you for allowing me to participate in the care of your patient.Armstead Peaks., MD

## 2015-08-29 ENCOUNTER — Other Ambulatory Visit (INDEPENDENT_AMBULATORY_CARE_PROVIDER_SITE_OTHER): Payer: Self-pay

## 2015-08-29 DIAGNOSIS — Z5181 Encounter for therapeutic drug level monitoring: Secondary | ICD-10-CM

## 2015-08-29 DIAGNOSIS — Z0289 Encounter for other administrative examinations: Secondary | ICD-10-CM

## 2015-08-30 ENCOUNTER — Telehealth: Payer: Self-pay

## 2015-08-30 LAB — PHENYTOIN LEVEL, TOTAL: Phenytoin (Dilantin), Serum: 11.3 ug/mL (ref 10.0–20.0)

## 2015-08-30 NOTE — Telephone Encounter (Signed)
-----   Message from Ward Givens, NP sent at 08/30/2015  1:16 PM EDT ----- Lab work ok. Please call patient.

## 2015-08-30 NOTE — Telephone Encounter (Signed)
I spoke to patient and she is aware of results.  

## 2015-12-24 ENCOUNTER — Telehealth: Payer: Self-pay | Admitting: Neurology

## 2015-12-24 NOTE — Telephone Encounter (Signed)
error 

## 2016-01-01 ENCOUNTER — Other Ambulatory Visit: Payer: Self-pay | Admitting: Infectious Diseases

## 2016-01-01 DIAGNOSIS — Z1231 Encounter for screening mammogram for malignant neoplasm of breast: Secondary | ICD-10-CM

## 2016-01-23 ENCOUNTER — Ambulatory Visit (INDEPENDENT_AMBULATORY_CARE_PROVIDER_SITE_OTHER): Payer: Medicare Other | Admitting: Neurology

## 2016-01-23 ENCOUNTER — Encounter: Payer: Self-pay | Admitting: Neurology

## 2016-01-23 VITALS — BP 130/70 | HR 72 | Resp 20 | Ht 64.0 in | Wt 127.0 lb

## 2016-01-23 DIAGNOSIS — R569 Unspecified convulsions: Secondary | ICD-10-CM

## 2016-01-23 MED ORDER — LAMOTRIGINE 100 MG PO TABS: ORAL_TABLET | ORAL | 3 refills | Status: DC

## 2016-01-23 MED ORDER — PHENYTOIN 50 MG PO CHEW: 50.0000 mg | CHEWABLE_TABLET | Freq: Every day | ORAL | 5 refills | Status: DC

## 2016-01-23 NOTE — Progress Notes (Signed)
PATIENT: Sally Green DOB: Aug 22, 1945  REASON FOR VISIT: follow up HISTORY FROM: patient  HISTORY OF PRESENT ILLNESS:  HISTORY 11/09/12 [facility]): The patient was diagnosed with an AV  in 2008 by Dr. Claiborne Billings , Dr Mable Fill in [place].  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  She has been  followed here at [facility] and parallel by Dr. Tommi Rumps at [facility].   The patient's spells are described as sensory spells the typical symptom of the tingling in the fingertips or even a whole course of sometimes the duration is less than a minute sometimes a bottle minutes there has never been a visible consult and she has never suffered any injuries related to one of these spells,  she does not lose awareness or consciousness. We performed a  Full EEG -polysomnography in August 2012. There was no seizure activity seen in sleep and the patient was neither excessively daytime sleepy nor did she have any apnea.  There was also no hypoxia noted. In August 20 13 she presented again with sensory spells the tingling of the fingertips and in dorsal. At that time she averaged 2 -3 brief spells per month. Her Dilantin level was therapeutic.  In January 2014, I increased the Lamictal doses in response to ongoing 2-3 seizures a month , paralleled to have decreased to one seizure a month for a brief period of time.  She takes Lamotrigine brand name Lamictal - 100 mg tablets 2 in the morning and 3 in the evening ( brand-name only but she could not longer afford this and switch to a generic). She also takes Dilantin brand-name 300 mg at night. She has always kept a seizure diary and she brings on today. On 05-14-12 her electrolytes were all  In normal limits,  her Dilantin level was 7.3 , and her GFR was normal. She is scheduled to have repaeat labs next week- before she sees Dr Tommi Rumps.  She have to spells in March one in April, 3 [place], 3  in June, 2 in July and one fit in August all of the duration of 1-2 minutes or less.  She reports that she has trouble speaking and finding words during this period , having  speech arrest.   MM_ Ms. Bartoszek is a 70 year old female with a history of AVM and seizures. She returns today for follow-up. She is currently taking Dilantin 300 mg at bedtime and lamictal 100 mg . Her last seizure was on Nov. 9th. She has several seizures since her last visit. She states that her seizures consist of a tingling sensation down the body.  On average her seizures will last 1-1.5 minutes. She states that in the last few years the longest she has gone without a seizure is 3 weeks. She operates a Teacher, music without difficulty. She is able to complete all ADLs independently. No changes in her gait or balance. The patient does report that her husband passed away 4 weeks ago. She states that she is having a hard time sleeping. She states that she wakes up several times a night.   CD Today's irevisit on 08-17-14 for this long-standing patient of mine. She endorsed today the Epworth sleepiness score at 5 points fatigue severity at 9 points and the depression scale geriatric depression scale at 1-2 points. The latter is remarkable because Sally Green lost her husband about 6 months ago. She now lives alone which has  been burdensome to her and she is also still not used to sleeping and living alone in a house she shared with her husband. She has family around and her grandson lives close by.   She reports her last seizure activity on a the third at about 7 AM she handed me a list of all seizures she has had in 2016.The medication has been unchanged, she states that she sleeps rather poorly. I would not be opposed to use a sleep aid for her as she has not had success with trying melatonin. Aware that sleep deprivation is also a seizure trigger. He has not been on antidepressants which may help her with her grieving . Her daughter  was diagnosed at age 8 with lung cancer of the left lung , and is undergoing chemotherapy . She has her third treatment tomorrow. This also worries her mother of course.  25th of October 2007, I reviewed Sally Green is a seizure diary and she is stable. We can refill at the current levels. I also will order a blood test for Dilantin and Lamictal level today.    REVIEW OF SYSTEMS: Out of a complete 14 system review of symptoms, the patient complains only of the following symptoms, and all other reviewed systems are negative.  Insomnia, depression, seizures. Anxiety - loss of husband and daughter diagnosed with lung cancer jan 2016.   ALLERGIES: Allergies  Allergen Reactions  . Depakote [Divalproex Sodium]   . Aspirin   . Keppra [Levetiracetam]   . Topamax [Topiramate]   . Guaifenesin Palpitations    HOME MEDICATIONS: Outpatient Medications Prior to Visit  Medication Sig Dispense Refill  . acetaminophen (TYLENOL) 325 MG tablet Take 650 mg by mouth every 6 (six) hours as needed.    Marland Kitchen alendronate (FOSAMAX) 70 MG tablet Take 70 mg by mouth every 7 (seven) days.  0  . atorvastatin (LIPITOR) 40 MG tablet Take 1 tablet by mouth daily at 6 PM.   1  . Calcium Carbonate-Vitamin D 600-400 MG-UNIT tablet Take by mouth.    . Oral Electrolytes (BUFFERED SALT PO) Take 1 tablet by mouth once a week.    . phenytoin (DILANTIN) 100 MG ER capsule Take 2 capsules (200 mg total) by mouth at bedtime. 270 capsule 3  . RA CALCIUM 600/VITAMIN D-3 600-400 MG-UNIT TABS   1  . lamoTRIgine (LAMICTAL) 100 MG tablet Take 2.5 tabs in the morning and take three in late afternoon 495 tablet 3  . phenytoin (DILANTIN) 50 MG tablet Chew 1 tablet (50 mg total) by mouth at bedtime. As of 07/20/15 Ward Givens NP changed total dose at bedtime to 250 mg. Patient to take one 50 mg tab with two 100 mg tabs at night to equal 250 mg total at bedtime. 90 tablet 5   No facility-administered medications prior to visit.     PAST  MEDICAL HISTORY: Past Medical History:  Diagnosis Date  . 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 AVM (arteriovenous malformation)   . Cancer ([place])   . Depression   . Fracture, foot    Right   . Hyperlipemia   . Hypersomnia   . Lung mass   . 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  Sees dr Tommi Rumps at [facility]  Oct 2014.    . Seizures ([place])     PAST SURGICAL HISTORY: Past Surgical  History:  Procedure Laterality Date  . BREAST BIOPSY Left 2004   neg  . ELECTROMAGNETIC NAVIGATION BROCHOSCOPY N/A 03/07/2015   Procedure: ELECTROMAGNETIC NAVIGATION BRONCHOSCOPY;  Surgeon: Flora Lipps, MD;  Location: [facility] ORS;  Service: Cardiopulmonary;  Laterality: N/A;  . SHOULDER SURGERY Left 2007  . VAGINAL DELIVERY     3 children    FAMILY HISTORY: Family History  Problem Relation Age of Onset  . Lymphoma Mother   . Alzheimer's disease Father   . Aneurysm Father   . Headache Father   . Headache Sister   . Cancer Sister     lung  . Cancer Other   . Aneurysm Paternal Uncle   . Cancer Maternal Aunt   . Breast cancer Maternal Aunt   . Cancer Paternal Aunt   . Leukemia Maternal Uncle     SOCIAL HISTORY: Social History   Social History  . Marital status: Married    Spouse name: [name]  . Number of children: 3  . Years of education: 12   Occupational History  . retired    Social History Main Topics  . Smoking status: Former Smoker    Packs/day: 1.25    Years: 50.00    Types: Cigarettes    Quit date: 06/24/2013  . Smokeless tobacco: Never Used  . Alcohol use No  . Drug use: No  . Sexual activity: Not on file   Other Topics Concern  . Not on file   Social History Narrative   Patient is married ([name]) and lives at home with her husband.   Patient has three children.   Patient is retired.   Patient has a high school  education.   Patient is right-handed.   Consumes caffeine rarely.    PHYSICAL EXAM  Vitals:   01/23/16 0953  BP: 130/70  Pulse: 72  Resp: 20  Weight: 127 lb (57.6 kg)  Height: '5\' 4"'$  (1.626 m)   Body mass index is 21.8 kg/m.  Generalized: Well developed, in no acute distress , groomed . notably bluish finger discoloration.  Cold fingers- raynaud's  Neurological examination   Fluent speech, alert , taste and smell intact.  Pupils equal, hearing intact,  full extraocular movements, intact finger perimetry , uvula and tongue midline.  Normal strength tone in bilateral equal grip strengths, normal deep tendon reflexes, symmetrically. Sensory to fine touch intact. Finger to nose with tremor.  Gait deferred.    DIAGNOSTIC DATA (LABS, IMAGING, TESTING) - I reviewed patient records, labs, notes, testing and imaging myself where available.  Lab Results  Component Value Date   WBC 5.5 07/19/2015   HGB 14.8 02/16/2014   HCT 42.7 07/19/2015   MCV 89 07/19/2015   PLT 240 07/19/2015      Component Value Date/Time   NA 144 07/19/2015 1340   NA 143 06/09/2013 1013   K 5.0 07/19/2015 1340   K 3.5 06/09/2013 1013   CL 100 07/19/2015 1340   CL 104 06/09/2013 1013   CO2 27 07/19/2015 1340   CO2 32 06/09/2013 1013   GLUCOSE 82 07/19/2015 1340   GLUCOSE 91 02/27/2015 1029   GLUCOSE 72 06/09/2013 1013   BUN 9 07/19/2015 1340   BUN 12 06/09/2013 1013   CREATININE 0.61 07/19/2015 1340   CREATININE 0.72 06/09/2013 1013   CALCIUM 9.9 07/19/2015 1340   CALCIUM 8.8 06/09/2013 1013   PROT 6.8 07/19/2015 1340   PROT 6.8 06/09/2013 1013   ALBUMIN 4.2 07/19/2015 1340   ALBUMIN 3.3 (L)  06/09/2013 1013   AST 20 07/19/2015 1340   AST 17 06/09/2013 1013   ALT 14 07/19/2015 1340   ALT 16 06/09/2013 1013   ALKPHOS 116 07/19/2015 1340   ALKPHOS 109 06/09/2013 1013   BILITOT <0.2 07/19/2015 1340   BILITOT 0.3 06/09/2013 1013   GFRNONAA 92 07/19/2015 1340   GFRNONAA >60 06/09/2013 1013    GFRAA 106 07/19/2015 1340   GFRAA >60 06/09/2013 1013      ASSESSMENT AND PLAN 70 y.o. 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 here with:  1. Ongoing seizure activity, which the patient describes as mini seizures. AVM related.  One in May 2017, 3 in June, 1 in July 3 in August, 2 in September and one in 2023/01/30. She has noticed some periodicity to her spells sometimes having 2 in a row and then time in between.   The patient is currently taking Dilantin 300 mg at bedtime  and Lamictal 100 mg 2 tablets in the morning and 3 tablets in the evening. Started Zoloft 50 mg in AM    The patient states that her last seizure was on November 9.The patient's husband passed Jan 29, 2014 , 6 month ago.  She is having trouble staying asleep at night., failed  melatonin 3 mg 1 hour before bedtime. Getting better.  She reports still grieving. ( Husband died 01/29/2014 ) . She sees a Social worker.   Labs today.   The patient will follow up in 6 months with NP or sooner if needed.   Larey Seat, MD   01/23/2016, 10:01 AM [facility] [address], [place], [place] [address] [phone]

## 2016-01-24 ENCOUNTER — Telehealth: Payer: Self-pay

## 2016-01-24 NOTE — Telephone Encounter (Signed)
I spoke to pt and advised her that her dilantin level is slightly decreased but not considered subtherapeutic because she has not had a rise in seizure activity. Dr. Brett Fairy wants pt to continue dilantin at the current dose. Pt verbalized understanding of results. Pt had no questions at this time but was encouraged to call back if questions arise.

## 2016-01-24 NOTE — Telephone Encounter (Signed)
-----   Message from Larey Seat, MD sent at 01/24/2016 12:59 PM EDT ----- Mrs. Sally Green Dilantin level is slightly decreased. It is not considered subtherapeutic given that she did not have a rise in seizure frequency. I like for her to continue at the current level.CD

## 2016-01-25 LAB — LAMOTRIGINE LEVEL: LAMOTRIGINE LVL: 5.5 ug/mL (ref 2.0–20.0)

## 2016-01-25 LAB — PHENYTOIN LEVEL, TOTAL: PHENYTOIN (DILANTIN), SERUM: 9.4 ug/mL — AB (ref 10.0–20.0)

## 2016-01-31 ENCOUNTER — Ambulatory Visit

## 2016-02-13 ENCOUNTER — Ambulatory Visit
Admission: RE | Admit: 2016-02-13 | Discharge: 2016-02-13 | Disposition: A | Discharge status: Home / Self Care | Payer: Medicare Other | Source: Ambulatory Visit | Attending: Radiation Oncology | Admitting: Radiation Oncology

## 2016-02-13 ENCOUNTER — Inpatient Hospital Stay: Payer: Medicare Other | Attending: Radiation Oncology

## 2016-02-13 DIAGNOSIS — C3411 Malignant neoplasm of upper lobe, right bronchus or lung: Secondary | ICD-10-CM | POA: Insufficient documentation

## 2016-02-13 DIAGNOSIS — E278 Other specified disorders of adrenal gland: Secondary | ICD-10-CM | POA: Diagnosis not present

## 2016-02-13 DIAGNOSIS — Y842 Radiological procedure and radiotherapy as the cause of abnormal reaction of the patient, or of later complication, without mention of misadventure at the time of the procedure: Secondary | ICD-10-CM | POA: Diagnosis not present

## 2016-02-13 DIAGNOSIS — R911 Solitary pulmonary nodule: Secondary | ICD-10-CM | POA: Insufficient documentation

## 2016-02-13 LAB — CREATININE, SERUM: Creatinine, Ser: 0.69 mg/dL (ref 0.44–1.00)

## 2016-02-13 MED ORDER — IOPAMIDOL (ISOVUE-370) INJECTION 76%
60.0000 mL | Freq: Once | INTRAVENOUS | Status: AC | PRN
  Administered 2016-02-13: 60 mL via INTRAVENOUS

## 2016-02-13 MED ORDER — IOPAMIDOL (ISOVUE-300) INJECTION 61%: 75.0000 mL | Freq: Once | INTRAVENOUS | Status: DC | PRN

## 2016-02-25 ENCOUNTER — Encounter: Payer: Self-pay | Admitting: Radiation Oncology

## 2016-02-25 ENCOUNTER — Ambulatory Visit
Admission: RE | Admit: 2016-02-25 | Discharge: 2016-02-25 | Disposition: A | Discharge status: Home / Self Care | Payer: Medicare Other | Source: Ambulatory Visit | Attending: Radiation Oncology | Admitting: Radiation Oncology

## 2016-02-25 VITALS — BP 154/72 | HR 84 | Temp 97.8°F | Resp 20 | Wt 128.4 lb

## 2016-02-25 DIAGNOSIS — Z85118 Personal history of other malignant neoplasm of bronchus and lung: Secondary | ICD-10-CM | POA: Insufficient documentation

## 2016-02-25 DIAGNOSIS — Z87891 Personal history of nicotine dependence: Secondary | ICD-10-CM | POA: Insufficient documentation

## 2016-02-25 DIAGNOSIS — Z923 Personal history of irradiation: Secondary | ICD-10-CM | POA: Diagnosis not present

## 2016-02-25 DIAGNOSIS — C3411 Malignant neoplasm of upper lobe, right bronchus or lung: Secondary | ICD-10-CM

## 2016-02-25 NOTE — Progress Notes (Signed)
Radiation Oncology Follow up Note  Name: Sally Green   Date:   02/25/2016 MRN:  [id number] DOB: 06-03-1945    This 70 y.o. female presents to the clinic today for 10 month follow-up status post SB RT of her right upper lobe.  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 She specifically denies cough hemoptysis or chest tightness. Recent CT scan shows excellent resolution of right upper lobe mass with radiation changes..  COMPLICATIONS OF TREATMENT: none  FOLLOW UP COMPLIANCE: keeps appointments   PHYSICAL EXAM:  BP (!) 154/72   Pulse 84   Temp 97.8 F (36.6 C)   Resp 20   Wt 128 lb 6.7 oz (58.3 kg)   BMI 22.04 kg/m  Well-developed well-nourished patient in NAD. HEENT reveals PERLA, EOMI, discs not visualized.  Oral cavity is clear. No oral mucosal lesions are identified. Neck is clear without evidence of cervical or supraclavicular adenopathy. Lungs are clear to A&P. Cardiac examination is essentially unremarkable with regular rate and rhythm without murmur rub or thrill. Abdomen is benign with no organomegaly or masses noted. Motor sensory and DTR levels are equal and symmetric in the upper and lower extremities. Cranial nerves II through XII are grossly intact. Proprioception is intact. No peripheral adenopathy or edema is identified. No motor or sensory levels are noted. Crude visual fields are within normal range.  RADIOLOGY RESULTS: Recent CT scan is reviewed and compatible with the above-stated findings  PLAN: Present time patient is doing well with no evidence of disease. I will start going out once a year and have CT obtained prior to her visit. Patient knows to call with any concerns.  I would like to take this opportunity to thank you for allowing me to participate in the care of your  patient.Armstead Peaks., MD

## 2016-03-05 ENCOUNTER — Ambulatory Visit
Admission: RE | Admit: 2016-03-05 | Discharge: 2016-03-05 | Disposition: A | Discharge status: Home / Self Care | Payer: Medicare Other | Source: Ambulatory Visit | Attending: Infectious Diseases | Admitting: Infectious Diseases

## 2016-03-05 DIAGNOSIS — Z1231 Encounter for screening mammogram for malignant neoplasm of breast: Secondary | ICD-10-CM | POA: Diagnosis not present

## 2016-05-12 ENCOUNTER — Telehealth: Payer: Self-pay | Admitting: Neurology

## 2016-05-12 NOTE — Telephone Encounter (Signed)
Patient states has had 4 seizures between 04-05-16 to 05-07-16. She has an appointment in April with [facility] but she wants to be seen sooner with Dr. Brett Fairy. Please call and advise.

## 2016-05-14 ENCOUNTER — Ambulatory Visit: Payer: Self-pay | Admitting: Neurology

## 2016-05-14 NOTE — Telephone Encounter (Signed)
I spoke to patient and she was able to take appt on Monday. Patient is aware to bring a driver.

## 2016-05-14 NOTE — Telephone Encounter (Signed)
I called pt, I offered her an appt today with Dr. Siri Cole 1:00pm but pt cannot make this appt because she does not have a driver. Pt asked [place] to please keep an eye out for any other openings.

## 2016-05-19 ENCOUNTER — Ambulatory Visit (INDEPENDENT_AMBULATORY_CARE_PROVIDER_SITE_OTHER): Payer: Medicare Other | Admitting: Neurology

## 2016-05-19 ENCOUNTER — Encounter: Payer: Self-pay | Admitting: Neurology

## 2016-05-19 VITALS — BP 170/79 | HR 86 | Resp 20 | Ht 64.0 in | Wt 123.0 lb

## 2016-05-19 DIAGNOSIS — G40001 Localization-related (focal) (partial) idiopathic epilepsy and epileptic syndromes with seizures of localized onset, not intractable, with status epilepticus: Secondary | ICD-10-CM

## 2016-05-19 MED ORDER — PHENYTOIN SODIUM EXTENDED 100 MG PO CAPS: 200.0000 mg | ORAL_CAPSULE | Freq: Every day | ORAL | 3 refills | Status: DC

## 2016-05-19 MED ORDER — LAMOTRIGINE 100 MG PO TABS: ORAL_TABLET | ORAL | 3 refills | Status: DC

## 2016-05-19 MED ORDER — PHENYTOIN 50 MG PO CHEW: 50.0000 mg | CHEWABLE_TABLET | Freq: Every day | ORAL | 5 refills | Status: DC

## 2016-05-19 NOTE — Progress Notes (Signed)
PATIENT: Sally Green DOB: [birth year]  REASON FOR VISIT: follow up HISTORY FROM: patient  HISTORY OF PRESENT ILLNESS:  HISTORY 11/09/12 [facility]): The patient was diagnosed with an AV  in 2008 by Dr. Claiborne Billings , Dr Mable Fill in [place].  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  She has been  followed here at [facility] and parallel by Dr. Tommi Rumps at [facility].   The patient's spells are described as sensory spells the typical symptom of the tingling in the fingertips or even a whole course of sometimes the duration is less than a minute sometimes a bottle minutes there has never been a visible consult and she has never suffered any injuries related to one of these spells,  she does not lose awareness or consciousness. We performed a  Full EEG -polysomnography in August 2012. There was no seizure activity seen in sleep and the patient was neither excessively daytime sleepy nor did she have any apnea.  There was also no hypoxia noted. In August 20 13 she presented again with sensory spells the tingling of the fingertips and in dorsal hands. At that time she averaged 2 -3 brief spells per month.  Her Dilantin level was therapeutic.  In January 2014, I increased the Lamictal doses in response to ongoing 2-3 seizures a month , paralleled to have decreased to one seizure a month for a brief period of time.  She takes Lamotrigine brand name Lamictal - 100 mg tablets 2 in the morning and 3 in the evening ( brand-name only but she could not longer afford this and switch to a generic). She also takes Dilantin brand-name 300 mg at night. She has always kept a seizure diary and she brings on today. On 05-14-12 her electrolytes were all  In normal limits,  her Dilantin level was 7.3 , and her GFR was normal. She is scheduled to have repaeat labs next week- before she sees Dr Tommi Rumps.  She have to spells in March one in April, 3  [place], 3 in June, 2 in July and one fit in August all of the duration of 1-2 minutes or less.  She reports that she has trouble speaking and finding words during this period , having  speech arrest.   MM_ Sally Green is a 71 year old female with a history of AVM and seizures. She returns today for follow-up. She is currently taking Dilantin 300 mg at bedtime and lamictal 100 mg . Her last seizure was on Nov. 9th. She has several seizures since her last visit. She states that her seizures consist of a tingling sensation down the body.  On average her seizures will last 1-1.5 minutes. She states that in the last few years the longest she has gone without a seizure is 3 weeks. She operates a Teacher, music without difficulty. She is able to complete all ADLs independently. No changes in her gait or balance. The patient does report that her husband passed away 4 weeks ago. She states that she is having a hard time sleeping. She states that she wakes up several times a night.   CD  revisit on 08-17-14 for this long-standing patient of mine. She endorsed today the Epworth sleepiness score at 5 points fatigue severity at 9 points and the depression scale geriatric depression scale at 1-2 points. The latter is remarkable because Sally Green lost her husband about 6 months ago. She now lives alone  which has been burdensome to her and she is also still not used to sleeping and living alone in a house she shared with her husband. She has family around and her grandson lives close by.   She reports her last seizure activity on a the third at about 7 AM she handed me a list of all seizures she has had in 2016.The medication has been unchanged, she states that she sleeps rather poorly. I would not be opposed to use a sleep aid for her as she has not had success with trying melatonin. Aware that sleep deprivation is also a seizure trigger. He has not been on antidepressants which may help her with her grieving . Her daughter was  diagnosed at age 71 with lung cancer of the left lung , and is undergoing chemotherapy . She has her third treatment tomorrow. This also worries her mother of course.  25th of October 2017, I reviewed Sally Green is a seizure diary and she is stable. We can refill at the current levels. I also will order a blood test for Dilantin and Lamictal level today. Ongoing seizure activity, which the patient describes as mini seizures, all  AVM related.  One in May 2017, 3 in June, 1 in July 3 in August, 2 in September and one in October. She has noticed some periodicity to her spells sometimes having 2 in a row and then time in between.   Interval history from 05/19/2016, I have pleasure of seeing Sally Green today in the company of her daughter. 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 Due to location the malformation is a resectable. She starts with a review of her seizure diary today: for the remainder of  2017 she had one seizure in October, one in November, 2 in December all lasting less than 2 minutes. In January she had 3 seizures, in February 1 as far as now.  250 mg at night of Dilantin, gets sleepy soon after. No side effect.  Lamictal well tolerated . She reports feeling worried , especially since her daughter is undergoing Chemotherapy. Diagnosed March 2016 with lung cancer.    REVIEW OF SYSTEMS: Out of a complete 14 system review of symptoms, the patient complains only of the following symptoms, and all other reviewed systems are negative.  Insomnia, depression, seizures. Anxiety - loss of husband and daughter diagnosed with lung cancer jan 2016.   ALLERGIES: Allergies  Allergen Reactions  . Depakote [Divalproex Sodium]   . Aspirin   . Keppra [Levetiracetam]   . Topamax [Topiramate]   . Guaifenesin Palpitations    HOME MEDICATIONS: Outpatient Medications Prior to Visit  Medication Sig  Dispense Refill  . acetaminophen (TYLENOL) 325 MG tablet Take 650 mg by mouth every 6 (six) hours as needed.    Marland Kitchen albuterol (PROVENTIL HFA;VENTOLIN HFA) 108 (90 Base) MCG/ACT inhaler Inhale into the lungs.    Marland Kitchen alendronate (FOSAMAX) 70 MG tablet Take 70 mg by mouth every 7 (seven) days.  0  . atorvastatin (LIPITOR) 40 MG tablet Take 1 tablet by mouth daily at 6 PM.   1  . benzonatate (TESSALON) 200 MG capsule Take by mouth.    . Calcium Carbonate-Vitamin D 600-400 MG-UNIT tablet Take by mouth.    . lamoTRIgine (LAMICTAL) 100 MG tablet Take 2.5 tabs in the morning and take three in late afternoon 495 tablet 3  . Oral Electrolytes (BUFFERED SALT PO) Take 1  tablet by mouth once a week.    . phenytoin (DILANTIN) 100 MG ER capsule Take 2 capsules (200 mg total) by mouth at bedtime. 270 capsule 3  . phenytoin (DILANTIN) 50 MG tablet Chew 1 tablet (50 mg total) by mouth at bedtime. As of 07/20/15 Ward Givens NP changed total dose at bedtime to 250 mg. Patient to take one 50 mg tab with two 100 mg tabs at night to equal 250 mg total at bedtime. 90 tablet 5  . RA CALCIUM 600/VITAMIN D-3 600-400 MG-UNIT TABS   1   No facility-administered medications prior to visit.     PAST MEDICAL HISTORY: Past Medical History:  Diagnosis Date  . 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 AVM (arteriovenous malformation)   . Cancer ([place])   . Depression   . Fracture, foot    Right   . Hyperlipemia   . Hypersomnia   . Lung mass   . 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  Sees dr Tommi Rumps at [facility]  Oct 2014.    . Seizures ([place])     PAST SURGICAL HISTORY: Past Surgical History:  Procedure Laterality Date  . BREAST BIOPSY Left 2004   neg  . ELECTROMAGNETIC NAVIGATION BROCHOSCOPY N/A 03/07/2015   Procedure: ELECTROMAGNETIC NAVIGATION BRONCHOSCOPY;   Surgeon: Flora Lipps, MD;  Location: [facility] ORS;  Service: Cardiopulmonary;  Laterality: N/A;  . SHOULDER SURGERY Left 2007  . VAGINAL DELIVERY     3 children    FAMILY HISTORY: Family History  Problem Relation Age of Onset  . Lymphoma Mother   . Alzheimer's disease Father   . Aneurysm Father   . Headache Father   . Cancer Other   . Aneurysm Paternal Uncle   . Headache Sister   . Cancer Sister     lung  . Cancer Maternal Aunt   . Breast cancer Maternal Aunt   . Cancer Paternal Aunt   . Leukemia Maternal Uncle     SOCIAL HISTORY: Social History   Social History  . Marital status: Married    Spouse name: [name]  . Number of children: 3  . Years of education: 12   Occupational History  . retired    Social History Main Topics  . Smoking status: Former Smoker    Packs/day: 1.25    Years: 50.00    Types: Cigarettes    Quit date: 06/24/2013  . Smokeless tobacco: Never Used  . Alcohol use No  . Drug use: No  . Sexual activity: Not on file   Other Topics Concern  . Not on file   Social History Narrative   Patient is married ([name]) and lives at home with her husband.   Patient has three children.   Patient is retired.   Patient has a high school education.   Patient is right-handed.   Consumes caffeine rarely.    PHYSICAL EXAM  Vitals:   05/19/16 0818  BP: (!) 170/79  Pulse: 86  Resp: 20  Weight: 123 lb (55.8 kg)  Height: '5\' 4"'$  (1.626 m)   Body mass index is 21.11 kg/m.  Generalized: Well developed, in no acute distress , groomed . notably bluish finger discoloration.  Cold fingers- Raynaud's  Neurological examination is unchanged , The patient is hoarse.   Fluent speech, alert , taste and smell intact.  Pupils equal, hearing intact,  full extraocular movements, intact finger perimetry , uvula and tongue midline.  Normal strength tone in bilateral equal grip strengths, normal deep tendon reflexes, symmetrically. Sensory to fine touch intact. Finger to  nose with tremor. Gait is not ataxic.     DIAGNOSTIC DATA (LABS, IMAGING, TESTING) - I reviewed patient records, labs, notes, testing and imaging myself where available.  I do  need to order new labs,  She sees her [facility]  PCP each  April and October.  I will ask to have CMET, CBC , dilantin  and Lamictal level.  ASSESSMENT AND PLAN 71 y.o. 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 here with:  1. The patient is currently taking Dilantin '250mg'$  at bedtime  and Lamictal 100 mg 2. 5  tablets in the morning and 3 tablets in the evening. Started Zoloft 50 mg in AM, continue Vit D and calcium and Lipitor.     The patient will follow up in 6 months with NP or sooner if needed.   Alam Guterrez, MD    05/19/2016, 8:43 AM [facility] [address] [place], [place] [address] [phone]

## 2016-05-20 ENCOUNTER — Telehealth: Payer: Self-pay | Admitting: *Deleted

## 2016-05-20 LAB — COMPREHENSIVE METABOLIC PANEL
ALBUMIN: 4.3 g/dL (ref 3.5–4.8)
ALK PHOS: 124 IU/L — AB (ref 39–117)
ALT: 13 IU/L (ref 0–32)
AST: 15 IU/L (ref 0–40)
Albumin/Globulin Ratio: 1.5 (ref 1.2–2.2)
BUN / CREAT RATIO: 17 (ref 12–28)
BUN: 12 mg/dL (ref 8–27)
Bilirubin Total: 0.2 mg/dL (ref 0.0–1.2)
CO2: 25 mmol/L (ref 18–29)
CREATININE: 0.7 mg/dL (ref 0.57–1.00)
Calcium: 10.1 mg/dL (ref 8.7–10.3)
Chloride: 95 mmol/L — ABNORMAL LOW (ref 96–106)
GFR calc Af Amer: 101 (ref >59)
GFR calc non Af Amer: 87 (ref >59)
GLUCOSE: 94 mg/dL (ref 65–99)
Globulin, Total: 2.8 (ref 1.5–4.5)
Potassium: 5 mmol/L (ref 3.5–5.2)
Sodium: 142 mmol/L (ref 134–144)
Total Protein: 7.1 g/dL (ref 6.0–8.5)

## 2016-05-20 LAB — CBC WITH DIFFERENTIAL/PLATELET
Basophils Absolute: 0 10*3/uL (ref 0.0–0.2)
Basos: 0 %
EOS (ABSOLUTE): 0.1 10*3/uL (ref 0.0–0.4)
Eos: 1 %
HEMATOCRIT: 45.2 % (ref 34.0–46.6)
HEMOGLOBIN: 15.2 g/dL (ref 11.1–15.9)
IMMATURE GRANS (ABS): 0 10*3/uL (ref 0.0–0.1)
IMMATURE GRANULOCYTES: 0 %
LYMPHS: 45 %
Lymphocytes Absolute: 2.6 10*3/uL (ref 0.7–3.1)
MCH: 31.7 pg (ref 26.6–33.0)
MCHC: 33.6 g/dL (ref 31.5–35.7)
MCV: 94 fL (ref 79–97)
MONOCYTES: 11 %
Monocytes Absolute: 0.7 10*3/uL (ref 0.1–0.9)
NEUTROS PCT: 43 %
Neutrophils Absolute: 2.5 10*3/uL (ref 1.4–7.0)
Platelets: 232 10*3/uL (ref 150–379)
RBC: 4.8 x10E6/uL (ref 3.77–5.28)
RDW: 14.2 % (ref 12.3–15.4)
WBC: 5.9 10*3/uL (ref 3.4–10.8)

## 2016-05-20 LAB — LAMOTRIGINE LEVEL: Lamotrigine Lvl: 4.5 ug/mL (ref 2.0–20.0)

## 2016-05-20 LAB — PHENYTOIN LEVEL, TOTAL: PHENYTOIN (DILANTIN), SERUM: 10.2 ug/mL (ref 10.0–20.0)

## 2016-05-20 NOTE — Telephone Encounter (Signed)
Per Dr Brett Fairy, spoke with patient and informed her that her labs show a normal CBC. The metabolic panel is also normal for her with COPD and osteopenia on Dilantin. Dr Dohmeier stated no change in medication is needed. Advised her this RN will route labs to her PCP. She verbalized understanding, appreciation.

## 2016-05-22 ENCOUNTER — Telehealth: Payer: Self-pay | Admitting: Neurology

## 2016-05-22 MED ORDER — SUVOREXANT 15 MG PO TABS: 15.0000 mg | ORAL_TABLET | Freq: Every evening | ORAL | 1 refills | Status: DC | PRN

## 2016-05-22 NOTE — Telephone Encounter (Signed)
Faxed in today. 

## 2016-05-22 NOTE — Addendum Note (Signed)
Addended by: Larey Seat on: 05/22/2016 04:23 PM   Modules accepted: Orders

## 2016-05-22 NOTE — Telephone Encounter (Signed)
Patient requesting Rx for Belsomra called to [facility] on [address] in [place] to help her sleep. She says has taken this before.

## 2016-05-30 IMAGING — CT NM PET TUM IMG INITIAL (PI) SKULL BASE T - THIGH
10 series · 24 of 25 positions shown · non-contrast
Comparison: 01/25/2015 is chest CT.  PET 06/16/2013.

CLINICAL DATA: Initial treatment strategy for right upper lobe Lung
nodule..

EXAM:
NUCLEAR MEDICINE PET SKULL BASE TO THIGH
TECHNIQUE: 11.9 mCi F-18 FDG was injected intravenously. Full-ring PET imaging
was performed from the skull base to thigh after the radiotracer. CT
data was obtained and used for attenuation correction and anatomic
localization.
FASTING BLOOD GLUCOSE:  Value: 78 mg/dl

[Series 3: ct wb 5.0 b30f · axial · 5.0mm · 0.98mm/px · z∈[-1438,-570]mm · 3 of 290 slices shown]
[im 1/290]
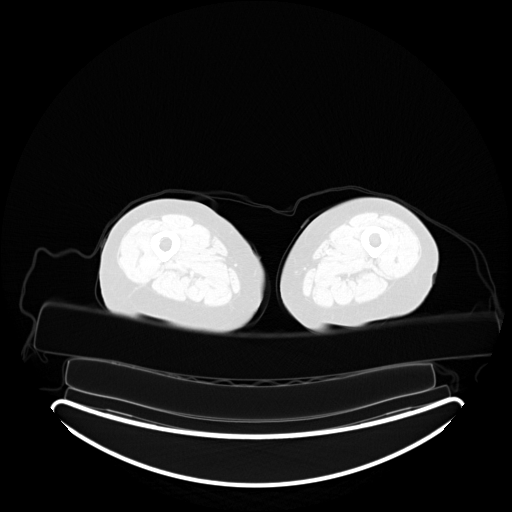
[im 145/290]
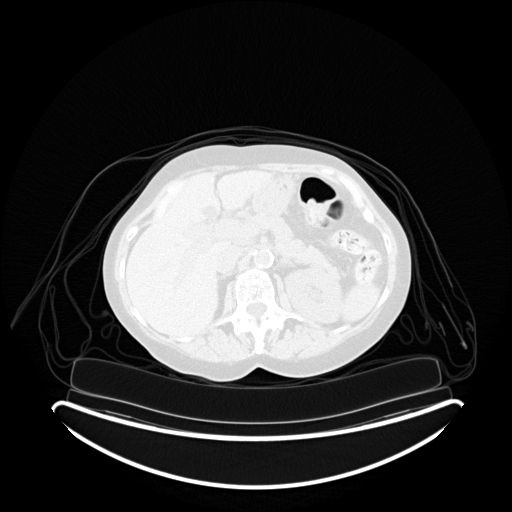
[im 290/290  brain]
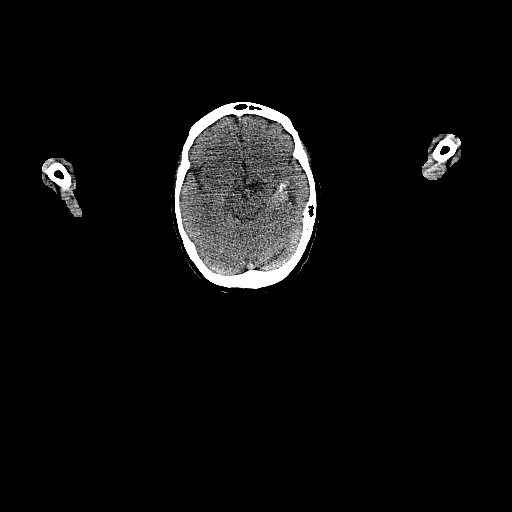

[Series 4: pet wb (ac) · axial · 5.0mm · 4.07mm/px · z∈[-1438,-570]mm · 3 of 290 slices shown]
[im 1/290]
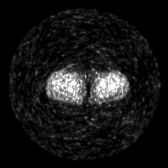
[im 145/290]
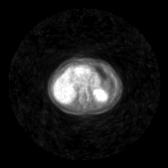
[im 290/290]
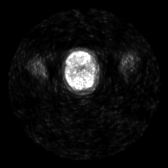

[Series 5: pet wb uncorrected (nac) · axial · 5.0mm · 4.07mm/px · z∈[-1438,-570]mm · 4 of 290 slices shown]
[im 1/290]
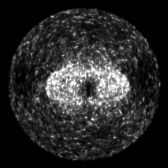
[im 97/290]
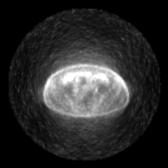
[im 193/290]
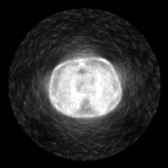
[im 290/290]
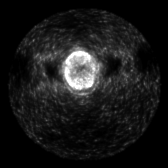

[Series 603: pet/ct axial · 3 of 288 slices shown]
[im 1/288]
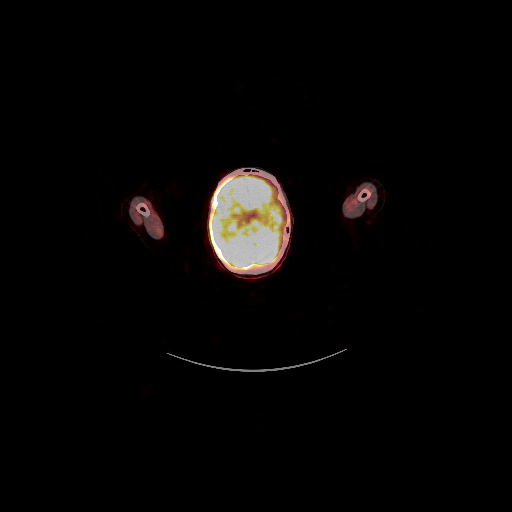
[im 96/288]
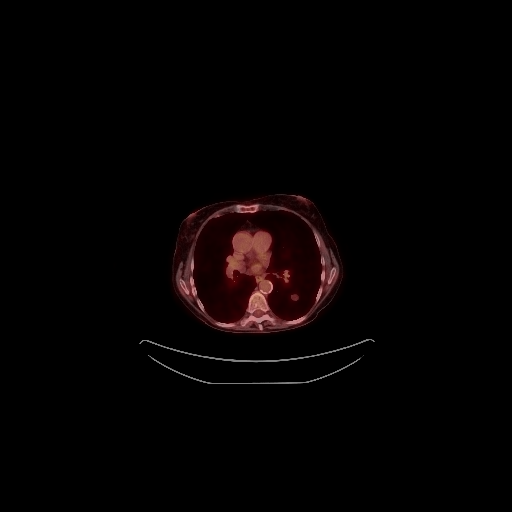
[im 288/288]
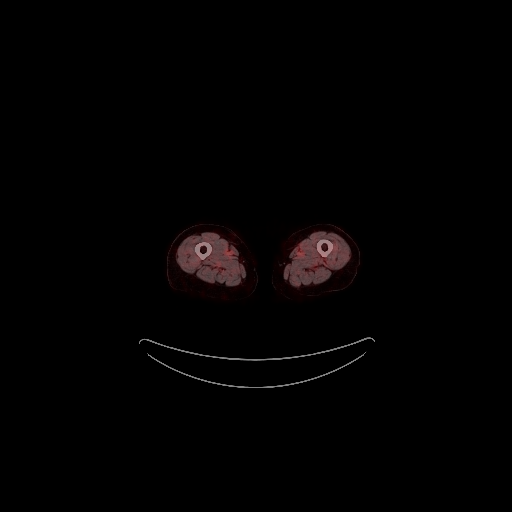

[Series 604: pet/ct coronal · 1 of 91 slices shown]
[im 1/91]
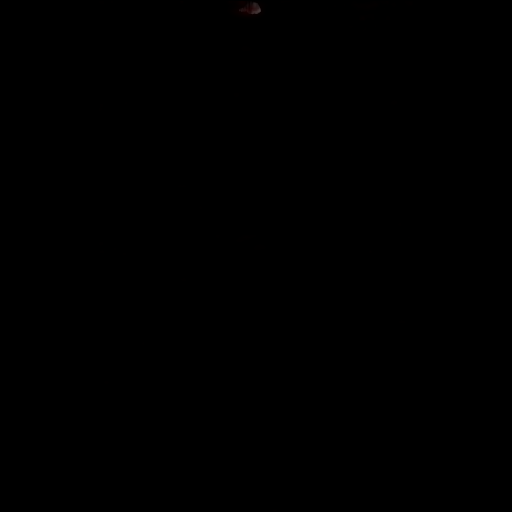

[Series 605: pet/ct sagittal · 2 of 124 slices shown]
[im 1/124]
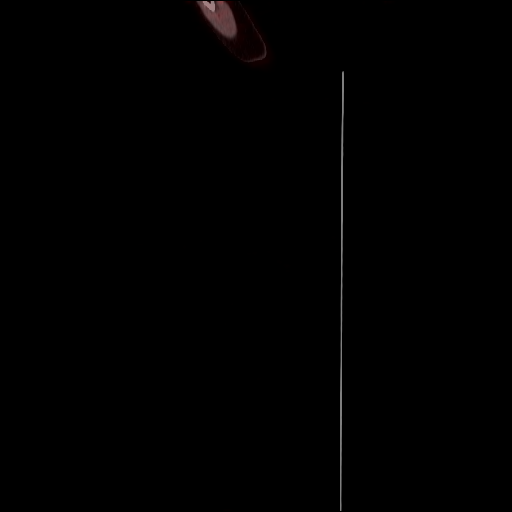
[im 124/124]
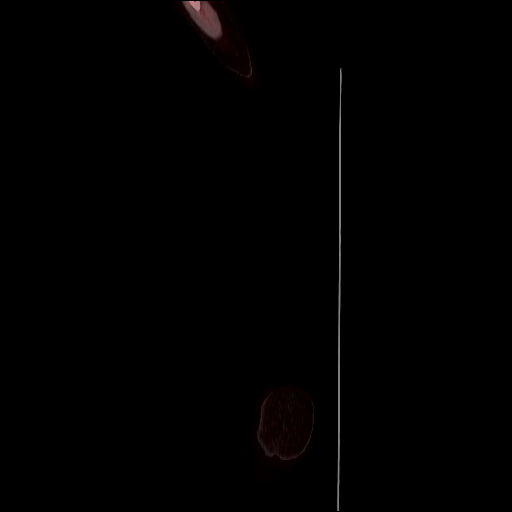

[Series 606: pet axial · 4 of 288 slices shown]
[im 1/288]
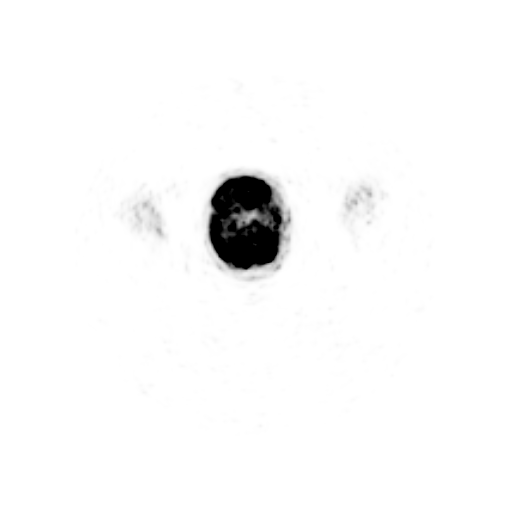
[im 96/288]
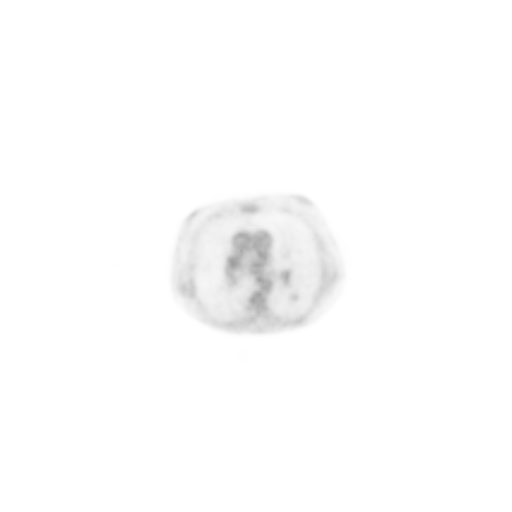
[im 192/288]
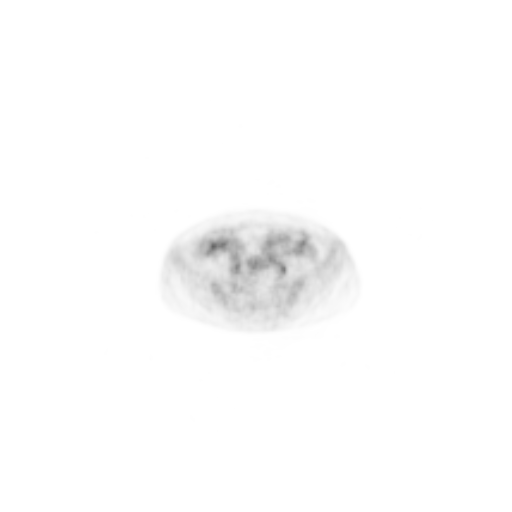
[im 288/288]
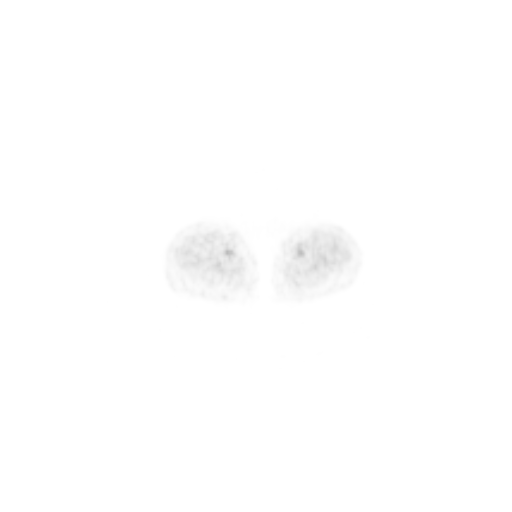

[Series 607: pet coronal · 1 of 92 slices shown]
[im 1/92]
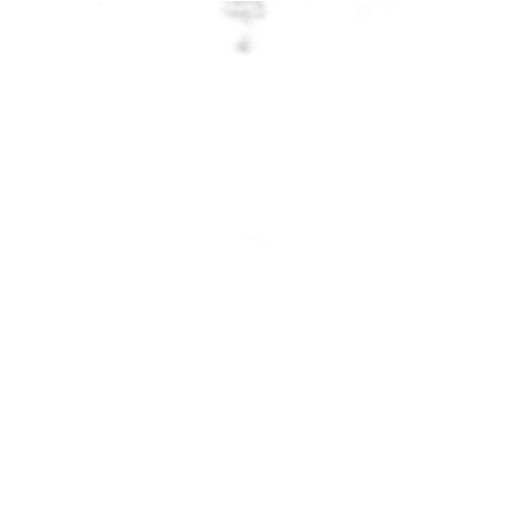

[Series 608: pet sagittal · 2 of 130 slices shown]
[im 1/130]
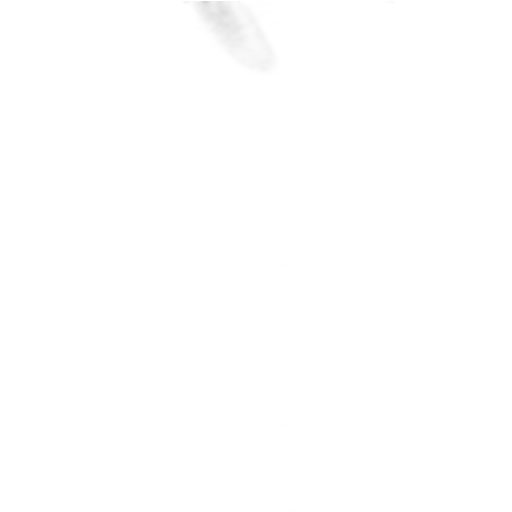
[im 130/130]
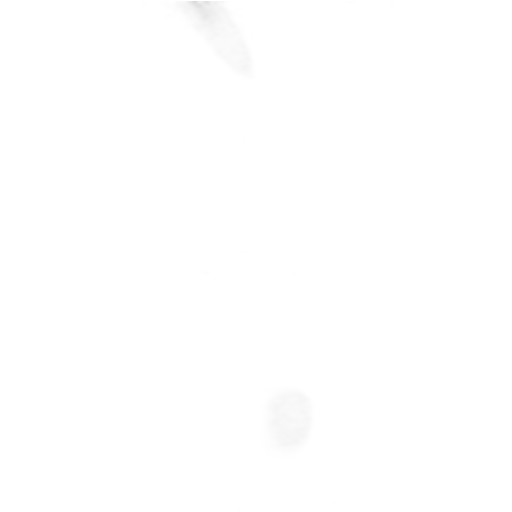

[Series 1078: results mm oncology reading · 0.95mm/px · 1 of 4 slices shown]
[im 1/4]
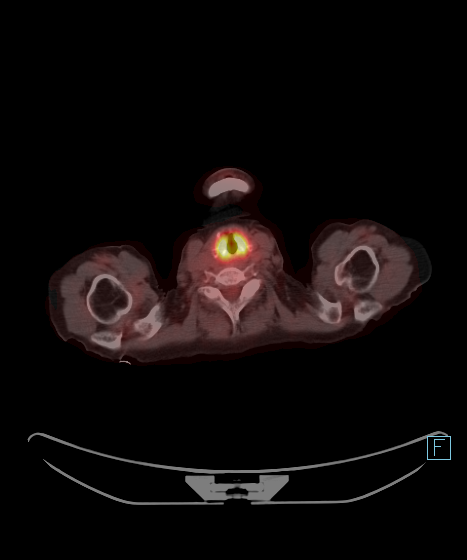

[24 of 25 positions shown; findings below may reference images not displayed]

FINDINGS: NECK

No hypermetabolic cervical nodes. Laryngeal hypermetabolism is
likely physiologic, without CT correlate.

CHEST

Hypermetabolism which corresponds to the enlarging right upper lobe
pulmonary nodule. This measures 9 mm and a S.U.V. max of 3.8 (image
70, series 3).

Well-circumscribed left lower lobe pulmonary nodule is again
identified. This measures 1.7 cm and a S.U.V. max of 1.1 on image
96. Back on 06/16/2013, this measured similar in size, and a S.U.V.
max of 0.9.

No thoracic nodal hypermetabolism.

ABDOMEN/PELVIS

Hypermetabolism in the region of the cecal tip. No dominant mass in
this area. This is adjacent to the a right side of the bladder, and
could be artifactual due to bladder activity. This measures a S.U.V.
max of 5.9 on image 222.

SKELETON

No abnormal marrow activity.

CT IMAGES PERFORMED FOR ATTENUATION CORRECTION

Subtle calcification in the left middle cranial fossa of (image 3,
series 3). This likely corresponds to the arterial venous
malformation on 08/13/2005 brain MR, but is incompletely evaluated
today. No cervical adenopathy. Bilateral carotid atherosclerosis.
Dense aortic and branch vessel atherosclerosis. Chest findings
deferred to recent diagnostic CT. LAD coronary artery
atherosclerosis. Centrilobular emphysema. Smaller right upper lobe
pulmonary nodule which is below the resolution of PET. Abdominal
aortic atherosclerosis without aneurysm. Left hepatic lobe cyst.
Right hepatic dome well-circumscribed lesion is also likely a cyst.
Large colonic stool burden. Hysterectomy.
IMPRESSION: 1. The right upper lobe pulmonary nodule is hypermetabolic, most
consistent with primary bronchogenic carcinoma.
2. No evidence of thoracic nodal or extra thoracic metastatic
disease.
3. Presuming non-small-cell histology, by imaging IBaYYBY or stage
IA.
4.  Atherosclerosis, including within the coronary arteries.
5. Left lower lobe pulmonary nodule is again favored to represent a
hamartoma and is similar.
6. Possible cecal hypermetabolism versus artifact from adjacent
urinary bladder activity. Correlate with any symptoms to suggest
colonic polyp. If the patient has not had routine screening
colonoscopy, this should be considered.

## 2016-07-02 NOTE — Progress Notes (Unsigned)
Opened in error

## 2016-07-23 ENCOUNTER — Other Ambulatory Visit: Payer: Self-pay | Admitting: Neurology

## 2016-07-23 ENCOUNTER — Ambulatory Visit: Payer: Medicare Other | Admitting: Adult Health

## 2016-07-24 NOTE — Telephone Encounter (Signed)
Rx due, up to date on appts, to be signed by on-call doctor.

## 2016-07-29 ENCOUNTER — Telehealth: Payer: Self-pay | Admitting: Neurology

## 2016-07-29 NOTE — Telephone Encounter (Signed)
error 

## 2016-08-13 ENCOUNTER — Other Ambulatory Visit: Payer: Self-pay | Admitting: Adult Health

## 2016-09-28 ENCOUNTER — Other Ambulatory Visit: Payer: Self-pay | Admitting: Neurology

## 2016-09-28 ENCOUNTER — Other Ambulatory Visit: Payer: Self-pay | Admitting: Adult Health

## 2016-09-28 DIAGNOSIS — G40001 Localization-related (focal) (partial) idiopathic epilepsy and epileptic syndromes with seizures of localized onset, not intractable, with status epilepticus: Secondary | ICD-10-CM

## 2016-09-30 NOTE — Telephone Encounter (Signed)
Pt called back said her bottle was filled on 05/19/16, #270, refill 2 times.  She said the bottle also reads "until 07/17/16".  FYI

## 2016-09-30 NOTE — Telephone Encounter (Signed)
I decreased to 250 at bedtime based on dilantin level. Please inquire with the patient about how she is taking it?

## 2016-09-30 NOTE — Telephone Encounter (Signed)
Spoke with patient to clarify dose of Dilantin she is taking at night. She stated she is taking two  100 mg tablets and chewing 1 50 mg tablet as instructed by [address]. She stated she is running out of 100 mg tablets but not the 50 mg tablets. This RN advised her that Dr Brett Fairy refilled 100 mg tablets in Feb with additional refills. Advised she should have refills. This RN will cal the pharmacy to check on this. [facility], spoke with Collie Siad who stated the pharmacy never received a refill Rx in Feb 2018. She stated patient needs new Rx.  Will route to NP for change in dosing.

## 2016-11-20 ENCOUNTER — Encounter: Payer: Self-pay | Admitting: Adult Health

## 2016-11-20 ENCOUNTER — Ambulatory Visit (INDEPENDENT_AMBULATORY_CARE_PROVIDER_SITE_OTHER): Payer: Medicare Other | Admitting: Adult Health

## 2016-11-20 VITALS — BP 142/61 | HR 73 | Wt 123.4 lb

## 2016-11-20 DIAGNOSIS — Z5181 Encounter for therapeutic drug level monitoring: Secondary | ICD-10-CM

## 2016-11-20 DIAGNOSIS — R569 Unspecified convulsions: Secondary | ICD-10-CM

## 2016-11-20 NOTE — Progress Notes (Signed)
PATIENT: Sally Green DOB: 1945/07/26   REASON FOR VISIT: follow up- seizures HISTORY FROM: patient  HISTORY OF PRESENT ILLNESS Today 11/20/16 Sally Green is a 71 year old female with a history of AVM and sensory type seizures. She reports that she overall is doing well. She states from June 24 - August 17 she is not had any seizure events. She states that this is the longest she is ever gone without having one. She continues to describe her seizures as a tingling sensation that goes down the center of the body. She states afterwards she has a hard time speaking. Reports that this lasted approximately 1-2 minutes and then she is completely back to normal. She continues on Lamictal and Dilantin. Reports that she is tolerating the medication well. Reports that she did have a fall 2 weeks ago as she tripped over the hose pipe in the yard. She returns today for an evaluation  HISTORY 07/19/15:Sally Green is a 71 year old female with a history of AVM and sensory type seizures. She returns today for follow-up. She continues to take Dilantin 300 mg at bedtime and Lamictal 100 mg 2.5 Tablets in the morning and 3 tablets in the evening. She reports that in November she only had 1 seizure. In December, February, March and April she had 2 seizures. She did not have any seizure events in January. Her seizures still consist of a tingling sensation that starts in the head and travels down the middle of the body. This only lasts for a couple of seconds. Afterward she has a hard time speaking. She states that she knows the words that she wants to say but is unable to pronounce them. This will last for approximately one and a half minutes. She denies any loss of consciousness. Denies any convulsing in the extremities. She does live at home alone. She is able to complete all ADLs independently. She operates a Teacher, music without difficulty. In the past she has not wanted to add on or increase her medication. She  returns today for an evaluation.    REVIEW OF SYSTEMS: Out of a complete 14 system review of symptoms, the patient complains only of the following symptoms, and all other reviewed systems are negative.  Seizures, speech difficulty  ALLERGIES: Allergies  Allergen Reactions  . Depakote [Divalproex Sodium]   . Aspirin     Instructed to never take ASA  . Keppra [Levetiracetam]   . Topamax [Topiramate]   . Guaifenesin Palpitations    HOME MEDICATIONS: Outpatient Medications Prior to Visit  Medication Sig Dispense Refill  . acetaminophen (TYLENOL) 325 MG tablet Take 650 mg by mouth every 6 (six) hours as needed.    Marland Kitchen albuterol (PROVENTIL HFA;VENTOLIN HFA) 108 (90 Base) MCG/ACT inhaler Inhale into the lungs.    Marland Kitchen alendronate (FOSAMAX) 70 MG tablet Take 70 mg by mouth every 7 (seven) days.  0  . atorvastatin (LIPITOR) 40 MG tablet Take 1 tablet by mouth daily at 6 PM.   1  . BELSOMRA 15 MG TABS take 1 tablet by mouth at bedtime if needed 30 tablet 0  . benzonatate (TESSALON) 200 MG capsule Take by mouth.    Marland Kitchen DILANTIN 100 MG ER capsule Take 2 capsules (200 mg total) by mouth at bedtime. 180 capsule 3  . lamoTRIgine (LAMICTAL) 100 MG tablet Take 2.5 tabs in the morning and take three in late afternoon 495 tablet 3  . Oral Electrolytes (BUFFERED SALT PO) Take 1 tablet by mouth once a  week.    . phenytoin (DILANTIN) 50 MG tablet Chew 1 tablet (50 mg total) by mouth at bedtime. As of 07/20/15 Ward Givens NP changed total dose at bedtime to 250 mg. Patient to take one 50 mg tab with two 100 mg tabs at night to equal 250 mg total at bedtime. 90 tablet 5  . RA CALCIUM 600/VITAMIN D-3 600-400 MG-UNIT TABS   1  . Calcium Carbonate-Vitamin D 600-400 MG-UNIT tablet Take by mouth.    . phenytoin (DILANTIN) 100 MG ER capsule Take 2 capsules (200 mg total) by mouth at bedtime. 270 capsule 3   No facility-administered medications prior to visit.     PAST MEDICAL HISTORY: Past Medical History:    Diagnosis Date  . 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 AVM (arteriovenous malformation)   . Cancer ([place])   . Depression   . Fracture, foot    Right   . Hyperlipemia   . Hypersomnia   . Lung mass   . 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  Sees dr Tommi Rumps at [facility]  Oct 2014.    . Seizures ([place])     PAST SURGICAL HISTORY: Past Surgical History:  Procedure Laterality Date  . BREAST BIOPSY Left 2004   neg  . ELECTROMAGNETIC NAVIGATION BROCHOSCOPY N/A 03/07/2015   Procedure: ELECTROMAGNETIC NAVIGATION BRONCHOSCOPY;  Surgeon: Flora Lipps, MD;  Location: [facility] ORS;  Service: Cardiopulmonary;  Laterality: N/A;  . SHOULDER SURGERY Left 2007  . VAGINAL DELIVERY     3 children    FAMILY HISTORY: Family History  Problem Relation Age of Onset  . Lymphoma Mother   . Alzheimer's disease Father   . Aneurysm Father   . Headache Father   . Cancer Other   . Aneurysm Paternal Uncle   . Headache Sister   . Cancer Sister        lung  . Cancer Maternal Aunt   . Breast cancer Maternal Aunt   . Cancer Paternal Aunt   . Leukemia Maternal Uncle     SOCIAL HISTORY: Social History   Social History  . Marital status: Married    Spouse name: [name]  . Number of children: 3  . Years of education: 12   Occupational History  . retired    Social History Main Topics  . Smoking status: Former Smoker    Packs/day: 1.25    Years: 50.00    Types: Cigarettes    Quit date: 06/24/2013  . Smokeless tobacco: Never Used  . Alcohol use No  . Drug use: No  . Sexual activity: Not on file   Other Topics Concern  . Not on file   Social History Narrative   Patient is married ([name]) and lives at home with her husband.   Patient has three children.   Patient is retired.   Patient has a high school education.   Patient is  right-handed.   Consumes caffeine rarely.      PHYSICAL EXAM  Vitals:   11/20/16 0926  BP: (!) 142/61  Pulse: 73  Weight: 123 lb 6.4 oz (56 kg)   Body mass index is 21.18 kg/m.  Generalized: Well developed, in no acute distress   Neurological examination  Mentation: Alert oriented to time, place, history taking. Follows all commands speech and language fluent Cranial nerve II-XII: Pupils were equal round reactive to  light. Extraocular movements were full, visual field were full on confrontational test. Facial sensation and strength were normal. Uvula tongue midline. Head turning and shoulder shrug  were normal and symmetric. Motor: The motor testing reveals 5 over 5 strength of all 4 extremities. Good symmetric motor tone is noted throughout.  Sensory: Sensory testing is intact to soft touch on all 4 extremities. No evidence of extinction is noted.  Coordination: Cerebellar testing reveals good finger-nose-finger and heel-to-shin bilaterally.  Gait and station: Gait is normal. Tandem gait is normal. Romberg is negative. No drift is seen.  Reflexes: Deep tendon reflexes are symmetric and normal bilaterally.   DIAGNOSTIC DATA (LABS, IMAGING, TESTING) - I reviewed patient records, labs, notes, testing and imaging myself where available.  Lab Results  Component Value Date   WBC 5.9 05/19/2016   HGB 15.2 05/19/2016   HCT 45.2 05/19/2016   MCV 94 05/19/2016   PLT 232 05/19/2016      Component Value Date/Time   NA 142 05/19/2016 0921   NA 143 06/09/2013 1013   K 5.0 05/19/2016 0921   K 3.5 06/09/2013 1013   CL 95 (L) 05/19/2016 0921   CL 104 06/09/2013 1013   CO2 25 05/19/2016 0921   CO2 32 06/09/2013 1013   GLUCOSE 94 05/19/2016 0921   GLUCOSE 91 02/27/2015 1029   GLUCOSE 72 06/09/2013 1013   BUN 12 05/19/2016 0921   BUN 12 06/09/2013 1013   CREATININE 0.70 05/19/2016 0921   CREATININE 0.72 06/09/2013 1013   CALCIUM 10.1 05/19/2016 0921   CALCIUM 8.8 06/09/2013 1013    PROT 7.1 05/19/2016 0921   PROT 6.8 06/09/2013 1013   ALBUMIN 4.3 05/19/2016 0921   ALBUMIN 3.3 (L) 06/09/2013 1013   AST 15 05/19/2016 0921   AST 17 06/09/2013 1013   ALT 13 05/19/2016 0921   ALT 16 06/09/2013 1013   ALKPHOS 124 (H) 05/19/2016 0921   ALKPHOS 109 06/09/2013 1013   BILITOT 0.2 05/19/2016 0921   BILITOT 0.3 06/09/2013 1013   GFRNONAA 87 05/19/2016 0921   GFRNONAA >60 06/09/2013 1013   GFRAA 101 05/19/2016 0921   GFRAA >60 06/09/2013 1013      ASSESSMENT AND PLAN 71 y.o. 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 here with :  1. Seizures 2. History of AVM  Overall the patient has remained stable. She will continue. I will check blood work today. She is advised that if her symptoms worsen or she develops new symptoms she should let us know. She will follow-up in 6 months or sooner if needed.  Ward Givens, MSN, NP-C 11/20/2016, 9:38 AM [facility] [address] [place], [place] [address] [phone]

## 2016-11-20 NOTE — Patient Instructions (Signed)
Your Plan:  Continue Dilantin and Lamictal Blood work today If your symptoms worsen or you develop new symptoms please let us know.    Thank you for coming to see [place] at [facility]. I hope we have been able to provide you high quality care today.  You may receive a patient satisfaction survey over the next few weeks. We would appreciate your feedback and comments so that we may continue to improve ourselves and the health of our patients.

## 2016-11-22 LAB — CBC WITH DIFFERENTIAL/PLATELET
Basophils Absolute: 0 10*3/uL (ref 0.0–0.2)
Basos: 0 %
EOS (ABSOLUTE): 0 10*3/uL (ref 0.0–0.4)
EOS: 1 %
HEMATOCRIT: 45.8 % (ref 34.0–46.6)
Hemoglobin: 15.6 g/dL (ref 11.1–15.9)
IMMATURE GRANS (ABS): 0 10*3/uL (ref 0.0–0.1)
IMMATURE GRANULOCYTES: 0 %
LYMPHS: 33 %
Lymphocytes Absolute: 2 10*3/uL (ref 0.7–3.1)
MCH: 32.4 pg (ref 26.6–33.0)
MCHC: 34.1 g/dL (ref 31.5–35.7)
MCV: 95 fL (ref 79–97)
Monocytes Absolute: 0.6 10*3/uL (ref 0.1–0.9)
Monocytes: 10 %
NEUTROS PCT: 56 %
Neutrophils Absolute: 3.4 10*3/uL (ref 1.4–7.0)
Platelets: 224 10*3/uL (ref 150–379)
RBC: 4.82 x10E6/uL (ref 3.77–5.28)
RDW: 14.7 % (ref 12.3–15.4)
WBC: 6.2 10*3/uL (ref 3.4–10.8)

## 2016-11-22 LAB — COMPREHENSIVE METABOLIC PANEL
ALT: 9 IU/L (ref 0–32)
AST: 17 IU/L (ref 0–40)
Albumin/Globulin Ratio: 1.4 (ref 1.2–2.2)
Albumin: 4 g/dL (ref 3.5–4.8)
Alkaline Phosphatase: 121 IU/L — ABNORMAL HIGH (ref 39–117)
BUN/Creatinine Ratio: 15 (ref 12–28)
BUN: 12 mg/dL (ref 8–27)
Bilirubin Total: 0.2 mg/dL (ref 0.0–1.2)
CALCIUM: 10 mg/dL (ref 8.7–10.3)
CO2: 27 mmol/L (ref 20–29)
CREATININE: 0.81 mg/dL (ref 0.57–1.00)
Chloride: 101 mmol/L (ref 96–106)
GFR, EST AFRICAN AMERICAN: 84 mL/min/{1.73_m2} (ref >59)
GFR, EST NON AFRICAN AMERICAN: 73 mL/min/{1.73_m2} (ref >59)
GLOBULIN, TOTAL: 2.8 g/dL (ref 1.5–4.5)
Glucose: 82 mg/dL (ref 65–99)
Potassium: 4 mmol/L (ref 3.5–5.2)
SODIUM: 146 mmol/L — AB (ref 134–144)
TOTAL PROTEIN: 6.8 g/dL (ref 6.0–8.5)

## 2016-11-22 LAB — LAMOTRIGINE LEVEL: Lamotrigine Lvl: 5.7 ug/mL (ref 2.0–20.0)

## 2016-11-22 LAB — PHENYTOIN LEVEL, TOTAL: Phenytoin (Dilantin), Serum: 10.3 ug/mL (ref 10.0–20.0)

## 2016-11-22 NOTE — Progress Notes (Signed)
I agree with the assessment and plan as directed by NP .The patient is known to me .   Rodman Recupero, MD  

## 2016-11-25 ENCOUNTER — Telehealth: Payer: Self-pay | Admitting: *Deleted

## 2016-11-25 NOTE — Telephone Encounter (Signed)
LVM informing patient her lab results are unremarkable. Left number for any questions.

## 2016-12-31 ENCOUNTER — Encounter: Payer: Self-pay | Admitting: *Deleted

## 2017-01-13 ENCOUNTER — Encounter: Payer: Self-pay | Admitting: Anesthesiology

## 2017-01-13 ENCOUNTER — Ambulatory Visit: Payer: Medicare Other | Admitting: Anesthesiology

## 2017-01-13 ENCOUNTER — Ambulatory Visit
Admission: RE | Admit: 2017-01-13 | Discharge: 2017-01-13 | Disposition: A | Discharge status: Home / Self Care | Payer: Medicare Other | Source: Ambulatory Visit | Attending: Ophthalmology | Admitting: Ophthalmology

## 2017-01-13 ENCOUNTER — Encounter
Admission: RE | Disposition: A | Discharge status: Home / Self Care | Payer: Self-pay | Source: Ambulatory Visit | Attending: Ophthalmology

## 2017-01-13 DIAGNOSIS — Z87891 Personal history of nicotine dependence: Secondary | ICD-10-CM | POA: Diagnosis not present

## 2017-01-13 DIAGNOSIS — Z79899 Other long term (current) drug therapy: Secondary | ICD-10-CM | POA: Diagnosis not present

## 2017-01-13 DIAGNOSIS — E7800 Pure hypercholesterolemia, unspecified: Secondary | ICD-10-CM | POA: Diagnosis not present

## 2017-01-13 DIAGNOSIS — Q2730 Arteriovenous malformation, site unspecified: Secondary | ICD-10-CM | POA: Insufficient documentation

## 2017-01-13 DIAGNOSIS — G40909 Epilepsy, unspecified, not intractable, without status epilepticus: Secondary | ICD-10-CM | POA: Diagnosis not present

## 2017-01-13 DIAGNOSIS — H2511 Age-related nuclear cataract, right eye: Secondary | ICD-10-CM | POA: Diagnosis not present

## 2017-01-13 HISTORY — DX: Unspecified hearing loss, unspecified ear: H91.90

## 2017-01-13 HISTORY — PX: CATARACT EXTRACTION W/PHACO: SHX586

## 2017-01-13 SURGERY — PHACOEMULSIFICATION, CATARACT, WITH IOL INSERTION
Anesthesia: Monitor Anesthesia Care | Site: Eye | Laterality: Right | Wound class: Clean

## 2017-01-13 MED ORDER — EPINEPHRINE PF 1 MG/ML IJ SOLN
INTRAMUSCULAR | Status: AC
  Filled 2017-01-13: qty 1

## 2017-01-13 MED ORDER — ARMC OPHTHALMIC DILATING DROPS
OPHTHALMIC | Status: AC
  Administered 2017-01-13: 1 via OPHTHALMIC
  Filled 2017-01-13: qty 0.4

## 2017-01-13 MED ORDER — CARBACHOL 0.01 % IO SOLN
INTRAOCULAR | Status: DC | PRN
  Administered 2017-01-13: .5 mL via INTRAOCULAR

## 2017-01-13 MED ORDER — ARMC OPHTHALMIC DILATING DROPS
1.0000 "application " | OPHTHALMIC | Status: AC
  Administered 2017-01-13 (×3): 1 via OPHTHALMIC

## 2017-01-13 MED ORDER — MIDAZOLAM HCL 2 MG/2ML IJ SOLN
INTRAMUSCULAR | Status: AC
  Filled 2017-01-13: qty 2

## 2017-01-13 MED ORDER — SODIUM CHLORIDE 0.9 % IV SOLN
INTRAVENOUS | Status: DC
  Administered 2017-01-13 (×2): via INTRAVENOUS

## 2017-01-13 MED ORDER — MOXIFLOXACIN HCL 0.5 % OP SOLN
OPHTHALMIC | Status: AC
  Filled 2017-01-13: qty 3

## 2017-01-13 MED ORDER — LIDOCAINE HCL (PF) 4 % IJ SOLN
INTRAMUSCULAR | Status: AC
  Filled 2017-01-13: qty 5

## 2017-01-13 MED ORDER — ALFENTANIL 500 MCG/ML IJ INJ
INJECTION | INTRAVENOUS | Status: DC | PRN
Start: 2017-01-13 — End: 2017-01-13
  Administered 2017-01-13: 500 ug via INTRAVENOUS

## 2017-01-13 MED ORDER — MOXIFLOXACIN HCL 0.5 % OP SOLN: 1.0000 [drp] | OPHTHALMIC | Status: DC | PRN

## 2017-01-13 MED ORDER — POVIDONE-IODINE 5 % OP SOLN
OPHTHALMIC | Status: AC
  Filled 2017-01-13: qty 30

## 2017-01-13 MED ORDER — LIDOCAINE HCL (PF) 4 % IJ SOLN
INTRAOCULAR | Status: DC | PRN
  Administered 2017-01-13: 2 mL via OPHTHALMIC

## 2017-01-13 MED ORDER — MOXIFLOXACIN HCL 0.5 % OP SOLN
OPHTHALMIC | Status: DC | PRN
  Administered 2017-01-13: 1 mL via OPHTHALMIC

## 2017-01-13 MED ORDER — NA CHONDROIT SULF-NA HYALURON 40-17 MG/ML IO SOLN
INTRAOCULAR | Status: AC
  Filled 2017-01-13: qty 1

## 2017-01-13 MED ORDER — POVIDONE-IODINE 5 % OP SOLN
OPHTHALMIC | Status: DC | PRN
  Administered 2017-01-13: 1 via OPHTHALMIC

## 2017-01-13 MED ORDER — MIDAZOLAM HCL 5 MG/5ML IJ SOLN
INTRAMUSCULAR | Status: DC | PRN
  Administered 2017-01-13: 2 mg via INTRAVENOUS

## 2017-01-13 SURGICAL SUPPLY — 18 items
GLOVE BIO SURGEON STRL SZ8 (GLOVE) ×3 IMPLANT
GLOVE BIOGEL M 6.5 STRL (GLOVE) ×3 IMPLANT
GLOVE SURG LX 8.0 MICRO (GLOVE) ×2
GLOVE SURG LX STRL 8.0 MICRO (GLOVE) ×1 IMPLANT
GOWN STRL REUS W/ TWL LRG LVL3 (GOWN DISPOSABLE) ×2 IMPLANT
GOWN STRL REUS W/TWL LRG LVL3 (GOWN DISPOSABLE) ×4
LABEL CATARACT MEDS ST (LABEL) ×3 IMPLANT
LENS IOL ACRSF IQ TRC 3 18.0 ×1 IMPLANT
LENS IOL ACRYSOF IQ TORIC 18.0 ×2 IMPLANT
LENS IOL IQ TORIC 3 18.0 ×1 IMPLANT
PACK CATARACT (MISCELLANEOUS) ×3 IMPLANT
PACK CATARACT BRASINGTON LX (MISCELLANEOUS) ×3 IMPLANT
PACK EYE AFTER SURG (MISCELLANEOUS) ×3 IMPLANT
SOL BSS BAG (MISCELLANEOUS) ×3
SOLUTION BSS BAG (MISCELLANEOUS) ×1 IMPLANT
SYR 5ML LL (SYRINGE) ×3 IMPLANT
WATER STERILE IRR 250ML POUR (IV SOLUTION) ×3 IMPLANT
WIPE NON LINTING 3.25X3.25 (MISCELLANEOUS) ×3 IMPLANT

## 2017-01-13 NOTE — Transfer of Care (Signed)
Immediate Anesthesia Transfer of Care Note  Patient: Sally Green  Procedure(s) Performed: CATARACT EXTRACTION PHACO AND INTRAOCULAR LENS PLACEMENT (IOC) (Right Eye)  Patient Location: PACU  Anesthesia Type:MAC  Level of Consciousness: awake, alert , oriented and patient cooperative  Airway & Oxygen Therapy: Patient Spontanous Breathing  Post-op Assessment: Report given to RN, Post -op Vital signs reviewed and stable and Patient moving all extremities X 4  Post vital signs: Reviewed and stable  Last Vitals:  Vitals:   01/13/17 0846 01/13/17 1041  BP: (!) 167/62 94/78  Pulse: 84   Resp: 18   Temp: (!) 35.9 C 36.8 C  SpO2: 96% 100%    Last Pain:  Vitals:   01/13/17 0846  TempSrc: Tympanic         Complications: No apparent anesthesia complications

## 2017-01-13 NOTE — H&P (Signed)
All labs reviewed. Abnormal studies sent to patients PCP when indicated.  Previous H&P reviewed, patient examined, there are NO CHANGES.  Sally Green LOUIS10/16/201810:07 AM

## 2017-01-13 NOTE — Discharge Instructions (Signed)
Eye Surgery Discharge Instructions  Expect mild scratchy sensation or mild soreness. DO NOT RUB YOUR EYE!  The day of surgery:  Minimal physical activity, but bed rest is not required  No reading, computer work, or close hand work  No bending, lifting, or straining.  May watch TV  For 24 hours:  No driving, legal decisions, or alcoholic beverages  Safety precautions  Eat anything you prefer: It is better to start with liquids, then soup then solid foods.  _____ Eye patch should be worn until postoperative exam tomorrow.  ____ Solar shield eyeglasses should be worn for comfort in the sunlight/patch while sleeping  Resume all regular medications including aspirin or Coumadin if these were discontinued prior to surgery. You may shower, bathe, shave, or wash your hair. Tylenol may be taken for mild discomfort.  Call your doctor if you experience significant pain, nausea, or vomiting, fever > 101 or other signs of infection. [phone] or [phone] Specific instructions:  Follow-up Information    Birder Robson, MD Follow up on 01/14/2017.   Specialty:  Ophthalmology Why:  9:10 Contact information: [address] [place] [address] [phone]

## 2017-01-13 NOTE — Anesthesia Post-op Follow-up Note (Signed)
Anesthesia QCDR form completed.        

## 2017-01-13 NOTE — Anesthesia Postprocedure Evaluation (Signed)
Anesthesia Post Note  Patient: Sally Green  Procedure(s) Performed: CATARACT EXTRACTION PHACO AND INTRAOCULAR LENS PLACEMENT ([place]) (Right Eye)  Patient location during evaluation: PACU Anesthesia Type: MAC Level of consciousness: awake and alert Pain management: pain level controlled Vital Signs Assessment: post-procedure vital signs reviewed and stable Respiratory status: spontaneous breathing, nonlabored ventilation and respiratory function stable Cardiovascular status: stable and blood pressure returned to baseline Postop Assessment: no apparent nausea or vomiting Anesthetic complications: no     Last Vitals:  Vitals:   01/13/17 0846 01/13/17 1041  BP: (!) 167/62 94/78  Pulse: 84   Resp: 18   Temp: (!) 35.9 C 36.8 C  SpO2: 96% 100%    Last Pain:  Vitals:   01/13/17 0846  TempSrc: Tympanic                 Silvana Newness A

## 2017-01-13 NOTE — Anesthesia Preprocedure Evaluation (Addendum)
Anesthesia Evaluation  Patient identified by MRN, date of birth, ID band Patient awake    Reviewed: Allergy & Precautions, NPO status , Patient's Chart, lab work & pertinent test results, reviewed documented beta blocker date and time   Airway Mallampati: II  TM Distance: >3 FB     Dental  (+) Upper Dentures, Lower Dentures   Pulmonary COPD,  COPD inhaler, former smoker,           Cardiovascular      Neuro/Psych Seizures -, Well Controlled,  PSYCHIATRIC DISORDERS Depression    GI/Hepatic   Endo/Other    Renal/GU      Musculoskeletal   Abdominal   Peds  Hematology   Anesthesia Other Findings AVMs in brain. Epilepsy sec to that. Takes dilantin etc.  Reproductive/Obstetrics                            Anesthesia Physical Anesthesia Plan  ASA: III  Anesthesia Plan: MAC   Post-op Pain Management:    Induction:   PONV Risk Score and Plan:   Airway Management Planned:   Additional Equipment:   Intra-op Plan:   Post-operative Plan:   Informed Consent: I have reviewed the patients History and Physical, chart, labs and discussed the procedure including the risks, benefits and alternatives for the proposed anesthesia with the patient or authorized representative who has indicated his/her understanding and acceptance.     Plan Discussed with: CRNA  Anesthesia Plan Comments:         Anesthesia Quick Evaluation

## 2017-01-13 NOTE — Op Note (Signed)
PREOPERATIVE DIAGNOSIS:  Nuclear sclerotic cataract of the right eye.   POSTOPERATIVE DIAGNOSIS:  Nuclear sclerotic cataract of the right eye.   OPERATIVE PROCEDURE: Procedure(s): CATARACT EXTRACTION PHACO AND INTRAOCULAR LENS PLACEMENT (IOC)   SURGEON:  Birder Robson, MD.   ANESTHESIA: 1.      Managed anesthesia care. 2.     0.86ml of Shugarcaine was instilled following the paracentesis  Anesthesiologist: Gunnar Bulla, MD CRNA: Silvana Newness, CRNA  COMPLICATIONS:  None.   TECHNIQUE:   Stop and chop    DESCRIPTION OF PROCEDURE:  The patient was examined and consented in the preoperative holding area where the aforementioned topical anesthesia was applied to the right eye.  The patient was brought back to the Operating Room where he was sat upright on the gurney and given a target to fixate upon while the eye was marked at the 3:00 and 9:00 position.  The patient was then reclined on the operating table.  The eye was prepped and draped in the usual sterile ophthalmic fashion and a lid speculum was placed. A paracentesis was created with the side port blade and the anterior chamber was filled with viscoelastic. A near clear corneal incision was performed with the steel keratome. A continuous curvilinear capsulorrhexis was performed with a cystotome followed by the capsulorrhexis forceps. Hydrodissection and hydrodelineation were carried out with BSS on a blunt cannula. The lens was removed in a stop and chop technique and the remaining cortical material was removed with the irrigation-aspiration handpiece. The eye was inflated with viscoelastic and the ZCT  lens  was placed in the eye and rotated to within a few degrees of the predetermined orientation.  The remaining viscoelastic was removed from the eye.  The Sinskey hook was used to rotate the toric lens into its final resting place at  021 degrees.  0. The eye was inflated to a physiologic pressure and found to be watertight. 0.66ml of  Vigamox was placed in the anterior chamber.  The eye was dressed with Vigamox. The patient was given protective glasses to wear throughout the day and a shield with which to sleep tonight. The patient was also given drops with which to begin a drop regimen today and will follow-up with me in one day.  Implant Name Type Inv. Item Serial No. Manufacturer Lot No. LRB No. Used  LENS IOL TORIC 18.0 - [id number]   LENS IOL TORIC 18.0 [id number] ALCON   Right 1   Procedure(s) with comments: CATARACT EXTRACTION PHACO AND INTRAOCULAR LENS PLACEMENT (IOC) (Right) - [place]  00:49.3 AP%  14.7 CDE   7.28 Fluid pack lot # [id number]  Electronically signed: Anaka Beazer LOUIS 01/13/2017 10:37 AM

## 2017-01-21 ENCOUNTER — Other Ambulatory Visit: Payer: Self-pay | Admitting: Infectious Diseases

## 2017-01-21 DIAGNOSIS — Z1231 Encounter for screening mammogram for malignant neoplasm of breast: Secondary | ICD-10-CM

## 2017-02-02 ENCOUNTER — Encounter: Payer: Self-pay | Admitting: *Deleted

## 2017-02-03 ENCOUNTER — Ambulatory Visit
Admission: RE | Admit: 2017-02-03 | Discharge: 2017-02-03 | Disposition: A | Discharge status: Home / Self Care | Payer: Medicare Other | Source: Ambulatory Visit | Attending: Ophthalmology | Admitting: Ophthalmology

## 2017-02-03 ENCOUNTER — Ambulatory Visit: Payer: Medicare Other | Admitting: Certified Registered"

## 2017-02-03 ENCOUNTER — Encounter: Payer: Self-pay | Admitting: Emergency Medicine

## 2017-02-03 ENCOUNTER — Encounter
Admission: RE | Disposition: A | Discharge status: Home / Self Care | Payer: Self-pay | Source: Ambulatory Visit | Attending: Ophthalmology

## 2017-02-03 DIAGNOSIS — Z888 Allergy status to other drugs, medicaments and biological substances status: Secondary | ICD-10-CM | POA: Insufficient documentation

## 2017-02-03 DIAGNOSIS — Q282 Arteriovenous malformation of cerebral vessels: Secondary | ICD-10-CM | POA: Insufficient documentation

## 2017-02-03 DIAGNOSIS — H2512 Age-related nuclear cataract, left eye: Secondary | ICD-10-CM | POA: Diagnosis present

## 2017-02-03 DIAGNOSIS — Z87891 Personal history of nicotine dependence: Secondary | ICD-10-CM | POA: Diagnosis not present

## 2017-02-03 DIAGNOSIS — J449 Chronic obstructive pulmonary disease, unspecified: Secondary | ICD-10-CM | POA: Diagnosis not present

## 2017-02-03 DIAGNOSIS — E7800 Pure hypercholesterolemia, unspecified: Secondary | ICD-10-CM | POA: Insufficient documentation

## 2017-02-03 DIAGNOSIS — G40802 Other epilepsy, not intractable, without status epilepticus: Secondary | ICD-10-CM | POA: Diagnosis not present

## 2017-02-03 DIAGNOSIS — Z79899 Other long term (current) drug therapy: Secondary | ICD-10-CM | POA: Diagnosis not present

## 2017-02-03 DIAGNOSIS — Z886 Allergy status to analgesic agent status: Secondary | ICD-10-CM | POA: Insufficient documentation

## 2017-02-03 DIAGNOSIS — F329 Major depressive disorder, single episode, unspecified: Secondary | ICD-10-CM | POA: Insufficient documentation

## 2017-02-03 HISTORY — PX: CATARACT EXTRACTION W/PHACO: SHX586

## 2017-02-03 SURGERY — PHACOEMULSIFICATION, CATARACT, WITH IOL INSERTION
Anesthesia: Monitor Anesthesia Care | Site: Eye | Laterality: Left | Wound class: Clean

## 2017-02-03 MED ORDER — ARMC OPHTHALMIC DILATING DROPS
OPHTHALMIC | Status: AC
  Administered 2017-02-03: 1 via OPHTHALMIC
  Filled 2017-02-03: qty 0.4

## 2017-02-03 MED ORDER — FENTANYL CITRATE (PF) 100 MCG/2ML IJ SOLN
INTRAMUSCULAR | Status: DC | PRN
Start: 2017-02-03 — End: 2017-02-03
  Administered 2017-02-03: 50 ug via INTRAVENOUS

## 2017-02-03 MED ORDER — NA CHONDROIT SULF-NA HYALURON 40-17 MG/ML IO SOLN
INTRAOCULAR | Status: DC | PRN
  Administered 2017-02-03: 1 mL via INTRAOCULAR

## 2017-02-03 MED ORDER — SODIUM CHLORIDE 0.9 % IV SOLN
INTRAVENOUS | Status: DC
  Administered 2017-02-03: 09:00:00 via INTRAVENOUS

## 2017-02-03 MED ORDER — MOXIFLOXACIN HCL 0.5 % OP SOLN
OPHTHALMIC | Status: DC | PRN
  Administered 2017-02-03: .2 mL via OPHTHALMIC

## 2017-02-03 MED ORDER — ARMC OPHTHALMIC DILATING DROPS
1.0000 "application " | OPHTHALMIC | Status: AC
  Administered 2017-02-03 (×3): 1 via OPHTHALMIC

## 2017-02-03 MED ORDER — MOXIFLOXACIN HCL 0.5 % OP SOLN
OPHTHALMIC | Status: AC
  Filled 2017-02-03: qty 3

## 2017-02-03 MED ORDER — LIDOCAINE HCL (PF) 4 % IJ SOLN
INTRAMUSCULAR | Status: DC | PRN
  Administered 2017-02-03: 2 mL via OPHTHALMIC

## 2017-02-03 MED ORDER — MOXIFLOXACIN HCL 0.5 % OP SOLN: 1.0000 [drp] | OPHTHALMIC | Status: DC | PRN

## 2017-02-03 MED ORDER — CARBACHOL 0.01 % IO SOLN
INTRAOCULAR | Status: DC | PRN
  Administered 2017-02-03: .5 mL via INTRAOCULAR

## 2017-02-03 MED ORDER — LIDOCAINE HCL (PF) 4 % IJ SOLN
INTRAMUSCULAR | Status: AC
  Filled 2017-02-03: qty 5

## 2017-02-03 MED ORDER — MIDAZOLAM HCL 2 MG/2ML IJ SOLN
INTRAMUSCULAR | Status: DC | PRN
  Administered 2017-02-03: 1 mg via INTRAVENOUS

## 2017-02-03 MED ORDER — EPINEPHRINE PF 1 MG/ML IJ SOLN
INTRAOCULAR | Status: DC | PRN
  Administered 2017-02-03: 1 mL via OPHTHALMIC

## 2017-02-03 MED ORDER — POVIDONE-IODINE 5 % OP SOLN
OPHTHALMIC | Status: DC | PRN
  Administered 2017-02-03: 1 via OPHTHALMIC

## 2017-02-03 MED ORDER — FENTANYL CITRATE (PF) 100 MCG/2ML IJ SOLN
INTRAMUSCULAR | Status: AC
  Filled 2017-02-03: qty 2

## 2017-02-03 MED ORDER — NA CHONDROIT SULF-NA HYALURON 40-17 MG/ML IO SOLN
INTRAOCULAR | Status: AC
  Filled 2017-02-03: qty 1

## 2017-02-03 MED ORDER — EPINEPHRINE PF 1 MG/ML IJ SOLN
INTRAMUSCULAR | Status: AC
  Filled 2017-02-03: qty 1

## 2017-02-03 MED ORDER — MIDAZOLAM HCL 2 MG/2ML IJ SOLN
INTRAMUSCULAR | Status: AC
  Filled 2017-02-03: qty 2

## 2017-02-03 MED ORDER — POVIDONE-IODINE 5 % OP SOLN
OPHTHALMIC | Status: AC
  Filled 2017-02-03: qty 30

## 2017-02-03 SURGICAL SUPPLY — 18 items
GLOVE BIO SURGEON STRL SZ8 (GLOVE) ×3 IMPLANT
GLOVE BIOGEL M 6.5 STRL (GLOVE) ×3 IMPLANT
GLOVE SURG LX 8.0 MICRO (GLOVE) ×2
GLOVE SURG LX STRL 8.0 MICRO (GLOVE) ×1 IMPLANT
GOWN STRL REUS W/ TWL LRG LVL3 (GOWN DISPOSABLE) ×2 IMPLANT
GOWN STRL REUS W/TWL LRG LVL3 (GOWN DISPOSABLE) ×4
LABEL CATARACT MEDS ST (LABEL) ×3 IMPLANT
LENS IOL ACRSF IQ TRC 3 18.5 ×1 IMPLANT
LENS IOL ACRYSOF IQ TORIC 18.5 ×2 IMPLANT
LENS IOL IQ TORIC 3 18.5 ×1 IMPLANT
PACK CATARACT (MISCELLANEOUS) ×3 IMPLANT
PACK CATARACT BRASINGTON LX (MISCELLANEOUS) ×3 IMPLANT
PACK EYE AFTER SURG (MISCELLANEOUS) ×3 IMPLANT
SOL BSS BAG (MISCELLANEOUS) ×3
SOLUTION BSS BAG (MISCELLANEOUS) ×1 IMPLANT
SYR 5ML LL (SYRINGE) ×3 IMPLANT
WATER STERILE IRR 250ML POUR (IV SOLUTION) ×3 IMPLANT
WIPE NON LINTING 3.25X3.25 (MISCELLANEOUS) ×3 IMPLANT

## 2017-02-03 NOTE — Op Note (Signed)
PREOPERATIVE DIAGNOSIS:  Nuclear sclerotic cataract of the left eye.   POSTOPERATIVE DIAGNOSIS:  Nuclear sclerotic cataract of the left eye.   OPERATIVE PROCEDURE: Procedure(s): CATARACT EXTRACTION PHACO AND INTRAOCULAR LENS PLACEMENT (IOC)   SURGEON:  Birder Robson, MD.   ANESTHESIA: 1.      Managed anesthesia care. 2.     0.47ml os Shugarcaine was instilled following the paracentesis [email or web address]@   COMPLICATIONS:  None.   TECHNIQUE:   Stop and chop    DESCRIPTION OF PROCEDURE:  The patient was examined and consented in the preoperative holding area where the aforementioned topical anesthesia was applied to the left eye.  The patient was brought back to the Operating Room where he was sat upright on the gurney and given a target to fixate upon while the eye was marked at the 3:00 and 9:00 position.  The patient was then reclined on the operating table.  The eye was prepped and draped in the usual sterile ophthalmic fashion and a lid speculum was placed. A paracentesis was created with the side port blade and the anterior chamber was filled with viscoelastic. A near clear corneal incision was performed with the steel keratome. A continuous curvilinear capsulorrhexis was performed with a cystotome followed by the capsulorrhexis forceps. Hydrodissection and hydrodelineation were carried out with BSS on a blunt cannula. The lens was removed in a stop and chop technique and the remaining cortical material was removed with the irrigation-aspiration handpiece. The eye was inflated with viscoelastic and the ZCT lens was placed in the eye and rotated to within a few degrees of the predetermined orientation.  The remaining viscoelastic was removed from the eye.  The Sinskey hook was used to rotate the toric lens into its final resting place at 163 degrees.  0.1 ml of Vigamox was placed in the anterior chamber. The eye was inflated to a physiologic pressure and found to be watertight.  The eye was dressed  with Vigamox. The patient was given protective glasses to wear throughout the day and a shield with which to sleep tonight. The patient was also given drops with which to begin a drop regimen today and will follow-up with me in one day. Implant Name Type Inv. Item Serial No. Manufacturer Lot No. LRB No. Used  LENS IOL TORIC 18.5 - [id number]  LENS IOL TORIC 18.5 [id number] ALCON  Left 1   Procedure(s) with comments: CATARACT EXTRACTION PHACO AND INTRAOCULAR LENS PLACEMENT (IOC) (Left) - [place] 00:41 AP% 14.3 CDE 6.01 Fluid pack lot # [id number]  Electronically signed: Marv Alfrey LOUIS 11/6/201810:40 AM

## 2017-02-03 NOTE — Anesthesia Preprocedure Evaluation (Signed)
Anesthesia Evaluation  Patient identified by MRN, date of birth, ID band Patient awake    Reviewed: Allergy & Precautions, NPO status , Patient's Chart, lab work & pertinent test results  History of Anesthesia Complications Negative for: history of anesthetic complications  Airway Mallampati: II  TM Distance: >3 FB Neck ROM: Full    Dental  (+) Upper Dentures, Lower Dentures   Pulmonary neg sleep apnea, COPD, former smoker,    breath sounds clear to auscultation- rhonchi (-) wheezing      Cardiovascular Exercise Tolerance: Good (-) hypertension(-) CAD, (-) Past MI and (-) Cardiac Stents  Rhythm:Regular Rate:Normal - Systolic murmurs and - Diastolic murmurs    Neuro/Psych Seizures - (partial, has problems with speech),  PSYCHIATRIC DISORDERS Depression AVM     GI/Hepatic negative GI ROS, Neg liver ROS,   Endo/Other  negative endocrine ROSneg diabetes  Renal/GU negative Renal ROS     Musculoskeletal negative musculoskeletal ROS (+)   Abdominal (+) - obese,   Peds  Hematology negative hematology ROS (+)   Anesthesia Other Findings Past Medical History: 11/1973: AVM (arteriovenous malformation) No date: AVM (arteriovenous malformation) No date: Cancer ([facility]) No date: Depression No date: Fracture, foot     Comment:  Right  No date: HOH (hard of hearing)     Comment:  AIDS No date: Hyperlipemia No date: Hypersomnia No date: Lung mass No date: 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  Sees dr               Tommi Rumps at [facility]  Oct 2014.   No date: Seizures [facility])     Comment:  LAST 9/18   Reproductive/Obstetrics                             Anesthesia Physical Anesthesia Plan  ASA: III  Anesthesia Plan: MAC   Post-op  Pain Management:    Induction: Intravenous  PONV Risk Score and Plan: 2 and Midazolam  Airway Management Planned: Natural Airway  Additional Equipment:   Intra-op Plan:   Post-operative Plan:   Informed Consent: I have reviewed the patients History and Physical, chart, labs and discussed the procedure including the risks, benefits and alternatives for the proposed anesthesia with the patient or authorized representative who has indicated his/her understanding and acceptance.     Plan Discussed with: CRNA and Anesthesiologist  Anesthesia Plan Comments:         Anesthesia Quick Evaluation

## 2017-02-03 NOTE — Transfer of Care (Signed)
Immediate Anesthesia Transfer of Care Note  Patient: Sally Green  Procedure(s) Performed: CATARACT EXTRACTION PHACO AND INTRAOCULAR LENS PLACEMENT (IOC) (Left Eye)  Patient Location: PACU  Anesthesia Type:MAC  Level of Consciousness: awake, alert  and oriented  Airway & Oxygen Therapy: Patient Spontanous Breathing  Post-op Assessment: Report given to RN and Post -op Vital signs reviewed and stable  Post vital signs: Reviewed and stable  Last Vitals:  Vitals:   02/03/17 1040 02/03/17 1041  BP: (!) 128/56 (!) 128/56  Pulse: 66 66  Resp: 16 12  Temp: (!) 36.1 C   SpO2: 100% 100%    Last Pain:  Vitals:   02/03/17 0858  TempSrc: Oral         Complications: No apparent anesthesia complications

## 2017-02-03 NOTE — Anesthesia Post-op Follow-up Note (Signed)
Anesthesia QCDR form completed.        

## 2017-02-03 NOTE — Discharge Instructions (Signed)
Eye Surgery Discharge Instructions  Expect mild scratchy sensation or mild soreness. DO NOT RUB YOUR EYE!  The day of surgery:  Minimal physical activity, but bed rest is not required  No reading, computer work, or close hand work  No bending, lifting, or straining.  May watch TV  For 24 hours:  No driving, legal decisions, or alcoholic beverages  Safety precautions  Eat anything you prefer: It is better to start with liquids, then soup then solid foods.  _____ Eye patch should be worn until postoperative exam tomorrow.  ____ Solar shield eyeglasses should be worn for comfort in the sunlight/patch while sleeping  Resume all regular medications including aspirin or Coumadin if these were discontinued prior to surgery. You may shower, bathe, shave, or wash your hair. Tylenol may be taken for mild discomfort.  Call your doctor if you experience significant pain, nausea, or vomiting, fever > 101 or other signs of infection. [phone] or [phone] Specific instructions:  Follow-up Information    Birder Robson, MD Follow up.   Specialty:  Ophthalmology Why:  November 7 at 8:40am Contact information: [address] [place] [address] [phone]

## 2017-02-03 NOTE — Anesthesia Postprocedure Evaluation (Signed)
Anesthesia Post Note  Patient: Sally Green  Procedure(s) Performed: CATARACT EXTRACTION PHACO AND INTRAOCULAR LENS PLACEMENT ([place]) (Left Eye)  Patient location during evaluation: PACU Anesthesia Type: MAC Level of consciousness: awake, awake and alert and oriented Pain management: pain level controlled Vital Signs Assessment: post-procedure vital signs reviewed and stable Respiratory status: spontaneous breathing, nonlabored ventilation and respiratory function stable Cardiovascular status: stable Anesthetic complications: no     Last Vitals:  Vitals:   02/03/17 1040 02/03/17 1041  BP: (!) 128/56 (!) 128/56  Pulse: 66 66  Resp: 16 12  Temp: (!) 36.1 C   SpO2: 100% 100%    Last Pain:  Vitals:   02/03/17 0858  TempSrc: Oral                 Lance Muss

## 2017-02-03 NOTE — H&P (Signed)
All labs reviewed. Abnormal studies sent to patients PCP when indicated.  Previous H&P reviewed, patient examined, there are NO CHANGES.  Sally Green LOUIS11/6/201810:11 AM

## 2017-03-06 ENCOUNTER — Other Ambulatory Visit: Payer: Self-pay | Admitting: *Deleted

## 2017-03-06 ENCOUNTER — Other Ambulatory Visit: Payer: Self-pay

## 2017-03-06 ENCOUNTER — Ambulatory Visit
Admission: RE | Admit: 2017-03-06 | Discharge: 2017-03-06 | Disposition: A | Discharge status: Home / Self Care | Payer: Medicare Other | Source: Ambulatory Visit | Attending: Radiation Oncology | Admitting: Radiation Oncology

## 2017-03-06 ENCOUNTER — Encounter: Payer: Self-pay | Admitting: Radiation Oncology

## 2017-03-06 VITALS — BP 144/72 | HR 81 | Resp 20 | Wt 125.6 lb

## 2017-03-06 DIAGNOSIS — Z87891 Personal history of nicotine dependence: Secondary | ICD-10-CM | POA: Insufficient documentation

## 2017-03-06 DIAGNOSIS — Z85118 Personal history of other malignant neoplasm of bronchus and lung: Secondary | ICD-10-CM | POA: Insufficient documentation

## 2017-03-06 DIAGNOSIS — Z923 Personal history of irradiation: Secondary | ICD-10-CM | POA: Diagnosis not present

## 2017-03-06 DIAGNOSIS — C3411 Malignant neoplasm of upper lobe, right bronchus or lung: Secondary | ICD-10-CM

## 2017-03-06 NOTE — Progress Notes (Signed)
Radiation Oncology Follow up Note  Name: Sally Green   Date:   03/06/2017 MRN:  [id number] DOB: [birth year]    This 71 y.o. female presents to the clinic today for a 2 year follow-up status post SB RT to her right upper lobe for stage I squamous cell carcinoma.  REFERRING PROVIDER: Leonel Ramsay, MD  HPI: Patient is a 71 year old female now seen out 2 years having completed SB RT to her right upper lobe for a stage I squamous cell carcinoma.. She is seen today in routine follow-up is doing well. She specifically denies cough hemoptysis chest tightness or any other change in pulmonary symptoms. She had a CT scan 1 year prior which I have reviewed. That CT scan shows radiation changes in the right upper lobe no other evidence of disease.  COMPLICATIONS OF TREATMENT: none  FOLLOW UP COMPLIANCE: keeps appointments   PHYSICAL EXAM:  BP (!) 144/72   Pulse 81   Resp 20   Wt 125 lb 8.8 oz (56.9 kg)   BMI 21.55 kg/m  Well-developed well-nourished patient in NAD. HEENT reveals PERLA, EOMI, discs not visualized.  Oral cavity is clear. No oral mucosal lesions are identified. Neck is clear without evidence of cervical or supraclavicular adenopathy. Lungs are clear to A&P. Cardiac examination is essentially unremarkable with regular rate and rhythm without murmur rub or thrill. Abdomen is benign with no organomegaly or masses noted. Motor sensory and DTR levels are equal and symmetric in the upper and lower extremities. Cranial nerves II through XII are grossly intact. Proprioception is intact. No peripheral adenopathy or edema is identified. No motor or sensory levels are noted. Crude visual fields are within normal range.  RADIOLOGY RESULTS: Prior CT scan is reviewed and compatible with the above-stated findings  PLAN: Present time patient is doing well and asymptomatic. I've ordered repeat CT scan with contrast of her chest in the next several weeks. I've asked to see her back in 1 year for  follow-up and will probably obtain a CT scan again at that time. Sugars CT scan have any changes will see her in follow-up appropriately. Patient continues to do well. She knows to call with any concerns at any time.  I would like to take this opportunity to thank you for allowing me to participate in the care of your patient.Armstead Peaks., MD

## 2017-03-18 ENCOUNTER — Inpatient Hospital Stay: Payer: Medicare Other | Attending: Radiation Oncology

## 2017-03-18 ENCOUNTER — Ambulatory Visit
Admission: RE | Admit: 2017-03-18 | Discharge: 2017-03-18 | Disposition: A | Discharge status: Home / Self Care | Payer: Medicare Other | Source: Ambulatory Visit | Attending: Radiation Oncology | Admitting: Radiation Oncology

## 2017-03-18 DIAGNOSIS — J439 Emphysema, unspecified: Secondary | ICD-10-CM | POA: Insufficient documentation

## 2017-03-18 DIAGNOSIS — C3411 Malignant neoplasm of upper lobe, right bronchus or lung: Secondary | ICD-10-CM | POA: Insufficient documentation

## 2017-03-18 DIAGNOSIS — I700 Atherosclerosis of aorta: Secondary | ICD-10-CM | POA: Insufficient documentation

## 2017-03-18 LAB — CREATININE, SERUM: Creatinine, Ser: 0.58 mg/dL (ref 0.44–1.00)

## 2017-03-18 MED ORDER — IOPAMIDOL (ISOVUE-300) INJECTION 61%
75.0000 mL | Freq: Once | INTRAVENOUS | Status: AC | PRN
  Administered 2017-03-18: 75 mL via INTRAVENOUS

## 2017-04-01 ENCOUNTER — Ambulatory Visit
Admission: RE | Admit: 2017-04-01 | Discharge: 2017-04-01 | Disposition: A | Discharge status: Home / Self Care | Payer: Medicare Other | Source: Ambulatory Visit | Attending: Infectious Diseases | Admitting: Infectious Diseases

## 2017-04-01 DIAGNOSIS — R928 Other abnormal and inconclusive findings on diagnostic imaging of breast: Secondary | ICD-10-CM | POA: Insufficient documentation

## 2017-04-01 DIAGNOSIS — Z1231 Encounter for screening mammogram for malignant neoplasm of breast: Secondary | ICD-10-CM | POA: Diagnosis present

## 2017-04-06 ENCOUNTER — Other Ambulatory Visit: Payer: Self-pay | Admitting: Infectious Diseases

## 2017-04-06 DIAGNOSIS — R928 Other abnormal and inconclusive findings on diagnostic imaging of breast: Secondary | ICD-10-CM

## 2017-04-06 DIAGNOSIS — N6489 Other specified disorders of breast: Secondary | ICD-10-CM

## 2017-04-09 ENCOUNTER — Encounter: Payer: Self-pay | Admitting: *Deleted

## 2017-04-10 ENCOUNTER — Ambulatory Visit
Admission: RE | Admit: 2017-04-10 | Discharge: 2017-04-10 | Disposition: A | Discharge status: Home / Self Care | Payer: Medicare Other | Source: Ambulatory Visit | Attending: Unknown Physician Specialty | Admitting: Unknown Physician Specialty

## 2017-04-10 ENCOUNTER — Ambulatory Visit: Payer: Medicare Other | Admitting: Anesthesiology

## 2017-04-10 ENCOUNTER — Encounter: Payer: Self-pay | Admitting: Anesthesiology

## 2017-04-10 ENCOUNTER — Encounter
Admission: RE | Disposition: A | Discharge status: Home / Self Care | Payer: Self-pay | Source: Ambulatory Visit | Attending: Unknown Physician Specialty

## 2017-04-10 DIAGNOSIS — Z85118 Personal history of other malignant neoplasm of bronchus and lung: Secondary | ICD-10-CM | POA: Insufficient documentation

## 2017-04-10 DIAGNOSIS — Z86010 Personal history of colonic polyps: Secondary | ICD-10-CM | POA: Insufficient documentation

## 2017-04-10 DIAGNOSIS — G40909 Epilepsy, unspecified, not intractable, without status epilepticus: Secondary | ICD-10-CM | POA: Diagnosis not present

## 2017-04-10 DIAGNOSIS — B20 Human immunodeficiency virus [HIV] disease: Secondary | ICD-10-CM | POA: Insufficient documentation

## 2017-04-10 DIAGNOSIS — F329 Major depressive disorder, single episode, unspecified: Secondary | ICD-10-CM | POA: Diagnosis not present

## 2017-04-10 DIAGNOSIS — J449 Chronic obstructive pulmonary disease, unspecified: Secondary | ICD-10-CM | POA: Insufficient documentation

## 2017-04-10 DIAGNOSIS — E785 Hyperlipidemia, unspecified: Secondary | ICD-10-CM | POA: Diagnosis not present

## 2017-04-10 DIAGNOSIS — K640 First degree hemorrhoids: Secondary | ICD-10-CM | POA: Insufficient documentation

## 2017-04-10 DIAGNOSIS — Z87891 Personal history of nicotine dependence: Secondary | ICD-10-CM | POA: Insufficient documentation

## 2017-04-10 DIAGNOSIS — Z1211 Encounter for screening for malignant neoplasm of colon: Secondary | ICD-10-CM | POA: Diagnosis present

## 2017-04-10 DIAGNOSIS — K621 Rectal polyp: Secondary | ICD-10-CM | POA: Insufficient documentation

## 2017-04-10 DIAGNOSIS — M810 Age-related osteoporosis without current pathological fracture: Secondary | ICD-10-CM | POA: Insufficient documentation

## 2017-04-10 DIAGNOSIS — Z79899 Other long term (current) drug therapy: Secondary | ICD-10-CM | POA: Diagnosis not present

## 2017-04-10 DIAGNOSIS — Z886 Allergy status to analgesic agent status: Secondary | ICD-10-CM | POA: Diagnosis not present

## 2017-04-10 DIAGNOSIS — Z888 Allergy status to other drugs, medicaments and biological substances status: Secondary | ICD-10-CM | POA: Insufficient documentation

## 2017-04-10 DIAGNOSIS — D122 Benign neoplasm of ascending colon: Secondary | ICD-10-CM | POA: Insufficient documentation

## 2017-04-10 HISTORY — DX: Unspecified convulsions: R56.9

## 2017-04-10 HISTORY — DX: Personal history of colon polyps, unspecified: Z86.0100

## 2017-04-10 HISTORY — DX: Menopausal and female climacteric states: N95.1

## 2017-04-10 HISTORY — DX: Personal history of colonic polyps: Z86.010

## 2017-04-10 HISTORY — DX: Nontraumatic intracerebral hemorrhage, unspecified: I61.9

## 2017-04-10 HISTORY — PX: COLONOSCOPY WITH PROPOFOL: SHX5780

## 2017-04-10 HISTORY — DX: Age-related osteoporosis without current pathological fracture: M81.0

## 2017-04-10 SURGERY — COLONOSCOPY WITH PROPOFOL
Anesthesia: General

## 2017-04-10 MED ORDER — SODIUM CHLORIDE 0.9 % IV SOLN: INTRAVENOUS | Status: DC

## 2017-04-10 MED ORDER — SODIUM CHLORIDE 0.9 % IV SOLN
INTRAVENOUS | Status: DC
  Administered 2017-04-10: 10:00:00 via INTRAVENOUS

## 2017-04-10 MED ORDER — MIDAZOLAM HCL 2 MG/2ML IJ SOLN
INTRAMUSCULAR | Status: AC
  Filled 2017-04-10: qty 2

## 2017-04-10 MED ORDER — LIDOCAINE HCL (PF) 2 % IJ SOLN
INTRAMUSCULAR | Status: AC
  Filled 2017-04-10: qty 10

## 2017-04-10 MED ORDER — PROPOFOL 500 MG/50ML IV EMUL
INTRAVENOUS | Status: AC
  Filled 2017-04-10: qty 50

## 2017-04-10 MED ORDER — LIDOCAINE HCL (CARDIAC) 20 MG/ML IV SOLN
INTRAVENOUS | Status: DC | PRN
  Administered 2017-04-10: 50 mg via INTRAVENOUS

## 2017-04-10 MED ORDER — PROPOFOL 10 MG/ML IV BOLUS
INTRAVENOUS | Status: DC | PRN
  Administered 2017-04-10: 400 mg via INTRAVENOUS

## 2017-04-10 NOTE — Anesthesia Postprocedure Evaluation (Signed)
Anesthesia Post Note  Patient: Sally Green  Procedure(s) Performed: COLONOSCOPY WITH PROPOFOL (N/A )  Patient location during evaluation: Endoscopy Anesthesia Type: General Level of consciousness: awake and alert Pain management: pain level controlled Vital Signs Assessment: post-procedure vital signs reviewed and stable Respiratory status: spontaneous breathing, nonlabored ventilation, respiratory function stable and patient connected to nasal cannula oxygen Cardiovascular status: blood pressure returned to baseline and stable Postop Assessment: no apparent nausea or vomiting Anesthetic complications: no     Last Vitals:  Vitals:   04/10/17 1142 04/10/17 1150  BP: (!) 114/38 (!) 112/41  Pulse: 71 75  Resp: 19 (!) 26  Temp: (!) 36.1 C   SpO2: 100% 100%    Last Pain:  Vitals:   04/10/17 1142  TempSrc: Tympanic                 Kazim Corrales S

## 2017-04-10 NOTE — Anesthesia Post-op Follow-up Note (Signed)
Anesthesia QCDR form completed.        

## 2017-04-10 NOTE — Transfer of Care (Signed)
Immediate Anesthesia Transfer of Care Note  Patient: Sally Green  Procedure(s) Performed: COLONOSCOPY WITH PROPOFOL (N/A )  Patient Location: PACU and Endoscopy Unit  Anesthesia Type:General  Level of Consciousness: drowsy  Airway & Oxygen Therapy: Patient Spontanous Breathing and Patient connected to nasal cannula oxygen  Post-op Assessment: Report given to RN and Post -op Vital signs reviewed and stable  Post vital signs: Reviewed and stable  Last Vitals:  Vitals:   04/10/17 1013  BP: (!) 168/71  Pulse: 92  Resp: (!) 22  Temp: (!) 35.6 C  SpO2: 98%    Last Pain:  Vitals:   04/10/17 1013  TempSrc: Tympanic         Complications: No apparent anesthesia complications

## 2017-04-10 NOTE — H&P (Signed)
Primary Care Physician:  Leonel Ramsay, MD Primary Gastroenterologist:  Dr. Vira Agar  Pre-Procedure History & Physical: HPI:  Sally Green is a 72 y.o. female is here for an colonoscopy.   Past Medical History:  Diagnosis Date  . 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 AVM (arteriovenous malformation)   . Cancer ([place]) 2017   lung  . Cerebral hemorrhage ([place])   . Convulsions ([place])   . Depression   . Fracture, foot    Right   . History of colon polyps   . HOH (hard of hearing)    AIDS  . Hyperlipemia   . Hypersomnia   . Lung mass   . Menopausal symptoms   . 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 Osteoporosis, postmenopausal   . 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  Sees dr Tommi Rumps at [facility]  Oct 2014.    . Seizures ([place])    LAST 9/18    Past Surgical History:  Procedure Laterality Date  . ABDOMINAL HYSTERECTOMY    . BREAST BIOPSY Left 2004   neg  . CATARACT EXTRACTION W/PHACO Right 01/13/2017   Procedure: CATARACT EXTRACTION PHACO AND INTRAOCULAR LENS PLACEMENT ([facility]);  Surgeon: Birder Robson, MD;  Location: [facility] ORS;  Service: Ophthalmology;  Laterality: Right;  [place]  00:49.3 AP%  14.7 CDE   7.28 Fluid pack lot # [id number]  . CATARACT EXTRACTION W/PHACO Left 02/03/2017   Procedure: CATARACT EXTRACTION PHACO AND INTRAOCULAR LENS PLACEMENT ([facility]);  Surgeon: Birder Robson, MD;  Location: [facility] ORS;  Service: Ophthalmology;  Laterality: Left;  [place] 00:41 AP% 14.3 CDE 6.01 Fluid pack lot # [id number]  . dental implants    . ELECTROMAGNETIC NAVIGATION BROCHOSCOPY N/A 03/07/2015   Procedure: ELECTROMAGNETIC NAVIGATION BRONCHOSCOPY;  Surgeon: Flora Lipps, MD;  Location: [facility] ORS;  Service: Cardiopulmonary;  Laterality: N/A;  . EYE SURGERY    . oophrectomy    . SHOULDER SURGERY Left 2007  . VAGINAL DELIVERY     3 children    Prior to  Admission medications   Medication Sig Start Date End Date Taking? Authorizing Provider  acetaminophen (TYLENOL) 500 MG tablet Take 500 mg by mouth daily as needed for moderate pain or headache.   Yes [provider]  albuterol (PROVENTIL HFA;VENTOLIN HFA) 108 (90 Base) MCG/ACT inhaler Inhale 2 puffs into the lungs every 6 (six) hours as needed for wheezing or shortness of breath.   Yes [provider]  atorvastatin (LIPITOR) 40 MG tablet Take 40 mg by mouth every evening.  12/05/14  Yes [provider]  DILANTIN 100 MG ER capsule Take 2 capsules (200 mg total) by mouth at bedtime. 09/30/16  Yes Ward Givens, NP  polyethylene glycol-electrolytes (NULYTELY/GOLYTELY) 420 g solution Take 4,000 mLs by mouth once.   Yes [provider]  Suvorexant 15 MG TABS Take 15 mg by mouth daily.   Yes [provider]  alendronate (FOSAMAX) 70 MG tablet Take by mouth. 01/22/15   [provider]  Calcium Carbonate-Vitamin D 600-400 MG-UNIT tablet Take 1 tablet by mouth 2 (two) times daily.  08/16/15 03/06/17  [provider]  DUREZOL 0.05 % EMUL Place 1 drop into the right eye daily. 01/02/17   [provider]  lamoTRIgine (LAMICTAL) 100 MG tablet Take 2.5 tabs in the morning and take three in late afternoon Patient taking differently: Take 1.5 tabs in the morning and take  three in late afternoon 05/19/16   Dohmeier, Asencion Partridge, MD  phenytoin (DILANTIN) 50 MG tablet Chew 1 tablet (50 mg total) by mouth at bedtime. As of 07/20/15 Ward Givens NP changed total dose at bedtime to 250 mg. Patient to take one 50 mg tab with two 100 mg tabs at night to equal 250 mg total at bedtime. 05/19/16   Dohmeier, Asencion Partridge, MD    Allergies as of 02/16/2017 - Review Complete 02/03/2017  Allergen Reaction Noted  . Depakote [divalproex sodium]  05/10/2012  . Aspirin  02/27/2015  . Keppra [levetiracetam]  05/10/2012  . Topamax [topiramate]  05/10/2012  . Guaifenesin  Palpitations 02/16/2014    Family History  Problem Relation Age of Onset  . Lymphoma Mother   . Alzheimer's disease Father   . Aneurysm Father   . Headache Father   . Cancer Other   . Aneurysm Paternal Uncle   . Headache Sister   . Cancer Sister        lung  . Cancer Maternal Aunt   . Breast cancer Maternal Aunt   . Cancer Paternal Aunt   . Leukemia Maternal Uncle     Social History   Socioeconomic History  . Marital status: Widowed    Spouse name: [name]  . Number of children: 3  . Years of education: 2  . Highest education level: Not on file  Social Needs  . Financial resource strain: Not on file  . Food insecurity - worry: Not on file  . Food insecurity - inability: Not on file  . Transportation needs - medical: Not on file  . Transportation needs - non-medical: Not on file  Occupational History  . Occupation: retired  Tobacco Use  . Smoking status: Former Smoker    Packs/day: 1.25    Years: 50.00    Pack years: 62.50    Types: Cigarettes    Last attempt to quit: 06/24/2013    Years since quitting: 3.7  . Smokeless tobacco: Never Used  Substance and Sexual Activity  . Alcohol use: No  . Drug use: No  . Sexual activity: Not on file  Other Topics Concern  . Not on file  Social History Narrative   Patient is married ([name]) and lives at home with her husband.   Patient has three children.   Patient is retired.   Patient has a high school education.   Patient is right-handed.   Consumes caffeine rarely.    Review of Systems: See HPI, otherwise negative ROS  Physical Exam: BP (!) 168/71   Pulse 92   Temp (!) 96.1 F (35.6 C) (Tympanic)   Resp (!) 22   Ht 5\' 4"  (1.626 m)   Wt 56.2 kg (124 lb)   SpO2 98%   BMI 21.28 kg/m  General:   Alert,  pleasant and cooperative in NAD Head:  Normocephalic and atraumatic. Neck:  Supple; no masses or thyromegaly. Lungs:  Clear throughout to auscultation.    Heart:  Regular rate and rhythm. Abdomen:  Soft,  nontender and nondistended. Normal bowel sounds, without guarding, and without rebound.   Neurologic:  Alert and  oriented x4;  grossly normal neurologically.  Impression/Plan: Sally Green is here for an colonoscopy to be performed for [facility] colon polyps.  Risks, benefits, limitations, and alternatives regarding  colonoscopy have been reviewed with the patient.  Questions have been answered.  All parties agreeable.   Gaylyn Cheers, MD  04/10/2017, 11:07 AM

## 2017-04-10 NOTE — Anesthesia Preprocedure Evaluation (Addendum)
Anesthesia Evaluation  Patient identified by MRN, date of birth, ID band Patient awake    Reviewed: Allergy & Precautions, NPO status , Patient's Chart, lab work & pertinent test results, reviewed documented beta blocker date and time   Airway Mallampati: III  TM Distance: >3 FB     Dental  (+) Chipped, Upper Dentures, Lower Dentures   Pulmonary COPD, former smoker,           Cardiovascular      Neuro/Psych Seizures -,     GI/Hepatic   Endo/Other    Renal/GU      Musculoskeletal   Abdominal   Peds  Hematology   Anesthesia Other Findings Lung ca. Seizures affects speech.No tonic clonic.  Reproductive/Obstetrics                            Anesthesia Physical Anesthesia Plan  ASA: III  Anesthesia Plan: General   Post-op Pain Management:    Induction: Intravenous  PONV Risk Score and Plan:   Airway Management Planned:   Additional Equipment:   Intra-op Plan:   Post-operative Plan:   Informed Consent: I have reviewed the patients History and Physical, chart, labs and discussed the procedure including the risks, benefits and alternatives for the proposed anesthesia with the patient or authorized representative who has indicated his/her understanding and acceptance.     Plan Discussed with: CRNA  Anesthesia Plan Comments:         Anesthesia Quick Evaluation

## 2017-04-10 NOTE — Op Note (Signed)
[facility] Gastroenterology Patient Name: Sally Green Procedure Date: 04/10/2017 11:08 AM MRN: [id number] Account #: [id number] Date of Birth: December 04, 1945 Admit Type: Outpatient Age: 72 Room: [facility] ENDO ROOM 3 Gender: Female Note Status: Finalized Procedure:            Colonoscopy Indications:          High risk colon cancer surveillance: Personal history                        of colonic polyps Providers:            Manya Silvas, MD Referring MD:         Adrian Prows (Referring MD) Medicines:            Propofol per Anesthesia Complications:        No immediate complications. Procedure:            Pre-Anesthesia Assessment:                       - After reviewing the risks and benefits, the patient                        was deemed in satisfactory condition to undergo the                        procedure.                       After obtaining informed consent, the colonoscope was                        passed under direct vision. Throughout the procedure,                        the patient's blood pressure, pulse, and oxygen                        saturations were monitored continuously. The                        Colonoscope was introduced through the anus and                        advanced to the the cecum, identified by appendiceal                        orifice and ileocecal valve. The colonoscopy was                        performed without difficulty. The patient tolerated the                        procedure well. The quality of the bowel preparation                        was good. Findings:      A small polyp was found in the ascending colon. The polyp was sessile.       The polyp was removed with a hot snare. Resection and retrieval were       complete.      Two sessile polyps were found in the  rectum. The polyps were diminutive       in size. These polyps were removed with a cold snare. Resection and       retrieval were complete.  Internal hemorrhoids were found during endoscopy. The hemorrhoids were       small and Grade I (internal hemorrhoids that do not prolapse).      A few small-mouthed diverticula were found in the sigmoid colon.      The exam was otherwise without abnormality. Impression:           - One small polyp in the ascending colon, removed with                        a hot snare. Resected and retrieved.                       - Two diminutive polyps in the rectum, removed with a                        cold snare. Resected and retrieved.                       - Internal hemorrhoids.                       - The examination was otherwise normal. Recommendation:       - Await pathology results. Manya Silvas, MD 04/10/2017 11:38:44 AM This report has been signed electronically. Number of Addenda: 0 Note Initiated On: 04/10/2017 11:08 AM Scope Withdrawal Time: 0 hours 15 minutes 53 seconds  Total Procedure Duration: 0 hours 22 minutes 47 seconds       [facility]

## 2017-04-13 ENCOUNTER — Encounter: Payer: Self-pay | Admitting: Unknown Physician Specialty

## 2017-04-13 LAB — SURGICAL PATHOLOGY

## 2017-04-16 ENCOUNTER — Ambulatory Visit
Admission: RE | Admit: 2017-04-16 | Discharge: 2017-04-16 | Disposition: A | Discharge status: Home / Self Care | Payer: Medicare Other | Source: Ambulatory Visit | Attending: Infectious Diseases | Admitting: Infectious Diseases

## 2017-04-16 DIAGNOSIS — R928 Other abnormal and inconclusive findings on diagnostic imaging of breast: Secondary | ICD-10-CM

## 2017-04-16 DIAGNOSIS — N6489 Other specified disorders of breast: Secondary | ICD-10-CM

## 2017-06-04 ENCOUNTER — Other Ambulatory Visit: Payer: Self-pay | Admitting: Neurology

## 2017-06-04 DIAGNOSIS — G40001 Localization-related (focal) (partial) idiopathic epilepsy and epileptic syndromes with seizures of localized onset, not intractable, with status epilepticus: Secondary | ICD-10-CM

## 2017-06-17 ENCOUNTER — Other Ambulatory Visit: Payer: Self-pay | Admitting: Neurology

## 2017-06-17 DIAGNOSIS — G40001 Localization-related (focal) (partial) idiopathic epilepsy and epileptic syndromes with seizures of localized onset, not intractable, with status epilepticus: Secondary | ICD-10-CM

## 2017-09-02 ENCOUNTER — Other Ambulatory Visit: Payer: Self-pay | Admitting: Neurology

## 2017-09-02 DIAGNOSIS — G40001 Localization-related (focal) (partial) idiopathic epilepsy and epileptic syndromes with seizures of localized onset, not intractable, with status epilepticus: Secondary | ICD-10-CM

## 2017-09-15 ENCOUNTER — Other Ambulatory Visit: Payer: Self-pay | Admitting: Adult Health

## 2017-11-24 ENCOUNTER — Encounter (INDEPENDENT_AMBULATORY_CARE_PROVIDER_SITE_OTHER): Payer: Self-pay | Admitting: Vascular Surgery

## 2017-11-24 ENCOUNTER — Ambulatory Visit (INDEPENDENT_AMBULATORY_CARE_PROVIDER_SITE_OTHER): Payer: Medicare Other | Admitting: Nurse Practitioner

## 2017-11-24 VITALS — BP 151/71 | HR 72 | Resp 16 | Ht 64.0 in | Wt 120.8 lb

## 2017-11-24 DIAGNOSIS — J449 Chronic obstructive pulmonary disease, unspecified: Secondary | ICD-10-CM

## 2017-11-24 DIAGNOSIS — E785 Hyperlipidemia, unspecified: Secondary | ICD-10-CM | POA: Diagnosis not present

## 2017-11-24 DIAGNOSIS — I83891 Varicose veins of right lower extremities with other complications: Secondary | ICD-10-CM

## 2017-11-24 NOTE — Progress Notes (Signed)
Subjective:    Patient ID: Sally Green, female    DOB: 1945-08-08, 72 y.o.   MRN: [id number] Chief Complaint  Patient presents with  . Follow-up    varicose veins    HPI  Sally Green is a 72 y.o. female who presents with pain, bruising and swelling following a fall. Symptom onset has been acute for a time period of 2 week(s). Severity is described as moderate to severe following the immediate.  After the fall. Course of her symptoms over time is resolved currently.  Following her fall the patient had bruising, pain, and tenderness of her varicose veins on her right lower extremity behind her knee.  She states that after the fall her varicosities decreased in size, however she was left with a large bruise and some tenderness in the area.  Currently there is a very small area of bruising and no tenderness of the area.  She denies any dizziness, lightheaded, syncope.  She denies any fever, chills, nausea, vomiting.  She denies any amaurosis fugax.  She denies any chest pain or shortness of breath.  Constitutional: [] Weight loss  [] Fever  [] Chills Cardiac: [] Chest pain   [] Chest pressure   [] Palpitations   [] Shortness of breath when laying flat   [] Shortness of breath with exertion. Vascular:  [] Pain in legs with walking   [] Pain in legs with standing  [] History of DVT   [] Phlebitis   [] Swelling in legs   [x] Varicose veins   [] Non-healing ulcers Pulmonary:   [] Uses home oxygen   [] Productive cough   [] Hemoptysis   [] Wheeze  [] COPD   [] Asthma Neurologic:  [] Dizziness   [] Seizures   [] History of stroke   [] History of TIA  [] Aphasia   [] Vissual changes   [] Weakness or numbness in arm   [] Weakness or numbness in leg Musculoskeletal:   [] Joint swelling   [] Joint pain   [] Low back pain Hematologic:  [] Easy bruising  [] Easy bleeding   [] Hypercoagulable state   [] Anemic Gastrointestinal:  [] Diarrhea   [] Vomiting  [] Gastroesophageal reflux/heartburn   [] Difficulty swallowing. Genitourinary:   [] Chronic kidney disease   [] Difficult urination  [] Frequent urination   [] Blood in urine Skin:  [] Rashes   [] Ulcers  Psychological:  [] History of anxiety   []  History of major depression.     Objective:   Physical Exam  BP (!) 151/71 (BP Location: Right Arm)   Pulse 72   Resp 16   Ht 5\' 4"  (1.626 m)   Wt 120 lb 12.8 oz (54.8 kg)   BMI 20.74 kg/m   Past Medical History:  Diagnosis Date  . 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 AVM (arteriovenous malformation)   . Cancer ([place]) 2017   lung  . Cerebral hemorrhage ([place])   . Convulsions ([place])   . Depression   . Fracture, foot    Right   . History of colon polyps   . HOH (hard of hearing)    AIDS  . Hyperlipemia   . Hypersomnia   . Lung mass   . Menopausal symptoms   . 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 Osteoporosis, postmenopausal   . 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  Sees dr Tommi [facility] at [facility]  Oct 2014.    . Seizures ([place])    LAST 9/18     Gen: WD/WN, NAD Head: [place]/AT, No temporalis wasting.  Ear/Nose/Throat: Hearing grossly intact, nares  w/o erythema or drainage Eyes: PER, EOMI, sclera nonicteric.  Neck: Supple, no masses.  No JVD.  Pulmonary:  Good air movement, no use of accessory muscles.  Cardiac: RRR Vascular:  Vessel Right Left  DP palpable Palpable   Gastrointestinal: soft, non-distended. No guarding/no peritoneal signs.  Musculoskeletal: M/S 5/5 throughout.  No deformity or atrophy.  Neurologic: Pain and light touch intact in extremities.  Symmetrical.  Speech is fluent. Motor exam as listed above. Psychiatric: Judgment intact, Mood & affect appropriate for pt's clinical situation. Dermatologic: No Venous rashes. No Ulcers Noted.  No changes consistent with cellulitis. Lymph : No Cervical lymphadenopathy, no lichenification or skin changes of chronic  lymphedema.   Social History   Socioeconomic History  . Marital status: Widowed    Spouse name: Sally Green  . Number of children: 3  . Years of education: 48  . Highest education level: Not on file  Occupational History  . Occupation: retired  Scientific laboratory technician  . Financial resource strain: Not on file  . Food insecurity:    Worry: Not on file    Inability: Not on file  . Transportation needs:    Medical: Not on file    Non-medical: Not on file  Tobacco Use  . Smoking status: Former Smoker    Packs/day: 1.25    Years: 50.00    Pack years: 62.50    Types: Cigarettes    Last attempt to quit: 06/24/2013    Years since quitting: 4.4  . Smokeless tobacco: Never Used  Substance and Sexual Activity  . Alcohol use: No  . Drug use: No  . Sexual activity: Not on file  Lifestyle  . Physical activity:    Days per week: Not on file    Minutes per session: Not on file  . Stress: Not on file  Relationships  . Social connections:    Talks on phone: Not on file    Gets together: Not on file    Attends religious service: Not on file    Active member of club or organization: Not on file    Attends meetings of clubs or organizations: Not on file    Relationship status: Not on file  . Intimate partner violence:    Fear of current or ex partner: Not on file    Emotionally abused: Not on file    Physically abused: Not on file    Forced sexual activity: Not on file  Other Topics Concern  . Not on file  Social History Narrative   Patient is married (Sally Green) and lives at home with her husband.   Patient has three children.   Patient is retired.   Patient has a high school education.   Patient is right-handed.   Consumes caffeine rarely.    Past Surgical History:  Procedure Laterality Date  . ABDOMINAL HYSTERECTOMY    . BREAST BIOPSY Left 2004   neg  . CATARACT EXTRACTION W/PHACO Right 01/13/2017   Procedure: CATARACT EXTRACTION PHACO AND INTRAOCULAR LENS PLACEMENT (IOC);  Surgeon:  Birder Robson, MD;  Location: [facility] ORS;  Service: Ophthalmology;  Laterality: Right;  [place]  00:49.3 AP%  14.7 CDE   7.28 Fluid pack lot # [id number]  . CATARACT EXTRACTION W/PHACO Left 02/03/2017   Procedure: CATARACT EXTRACTION PHACO AND INTRAOCULAR LENS PLACEMENT (IOC);  Surgeon: Birder Robson, MD;  Location: [facility] ORS;  Service: Ophthalmology;  Laterality: Left;  [place] 00:41 AP% 14.3 CDE 6.01 Fluid pack lot # [id number]  . COLONOSCOPY WITH PROPOFOL N/A  04/10/2017   Procedure: COLONOSCOPY WITH PROPOFOL;  Surgeon: Manya Silvas, MD;  Location: [facility]COPY;  Service: Endoscopy;  Laterality: N/A;  . dental implants    . ELECTROMAGNETIC NAVIGATION BROCHOSCOPY N/A 03/07/2015   Procedure: ELECTROMAGNETIC NAVIGATION BRONCHOSCOPY;  Surgeon: Flora Lipps, MD;  Location: [facility] ORS;  Service: Cardiopulmonary;  Laterality: N/A;  . EYE SURGERY    . oophrectomy    . SHOULDER SURGERY Left 2007  . VAGINAL DELIVERY     3 children    Family History  Problem Relation Age of Onset  . Lymphoma Mother   . Alzheimer's disease Father   . Aneurysm Father   . Headache Father   . Cancer Other   . Aneurysm Paternal Uncle   . Headache Sister   . Cancer Sister        lung  . Cancer Maternal Aunt   . Breast cancer Maternal Aunt   . Cancer Paternal Aunt   . Leukemia Maternal Uncle     Allergies  Allergen Reactions  . Depakote [Divalproex Sodium]     Nervous   . Aspirin     Instructed to never take ASA  . Keppra [Levetiracetam]     unknown  . Topamax [Topiramate]     unknown  . Guaifenesin Palpitations       Assessment & Plan:   1. 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  The patient is currently pain-free with just a small quarter sized bruise behind her right knee.  The patient stated that she only has swelling  occasionally.  Counseled her to continue to utilize medical grade 1 compression stockings for both legs, as well as elevation of her legs is well.  She can also use NSAIDs as tolerated for pain.  No further interventions recommended at this time  2. COPD, severe ([place]) Continue pulmonary medications and aerosols as already ordered, these medications have been reviewed and there are no changes at this time.    3. Hyperlipidemia, unspecified hyperlipidemia type Continue statin as ordered and reviewed, no changes at this time   Current Outpatient Medications on File Prior to Visit  Medication Sig Dispense Refill  . acetaminophen (TYLENOL) 500 MG tablet Take 500 mg by mouth daily as needed for moderate pain or headache.    . albuterol (PROVENTIL HFA;VENTOLIN HFA) 108 (90 Base) MCG/ACT inhaler Inhale 2 puffs into the lungs every 6 (six) hours as needed for wheezing or shortness of breath.    Marland Kitchen atorvastatin (LIPITOR) 40 MG tablet Take 40 mg by mouth every evening.   1  . Calcium Carbonate-Vitamin D 600-400 MG-UNIT tablet Take 1 tablet by mouth 2 (two) times daily.     Marland Kitchen FLUoxetine (PROZAC) 10 MG capsule Take by mouth.    . lamoTRIgine (LAMICTAL) 100 MG tablet take 2 and 1/2 tablets by mouth every morning and take 3 tablets by mouth IN TH E LATE AFTERNOON 495 tablet 0  . alendronate (FOSAMAX) 70 MG tablet Take by mouth.    Marland Kitchen DILANTIN 100 MG ER capsule take 2 capsules by mouth at bedtime (Patient not taking: Reported on 11/24/2017) 180 capsule 0  . DUREZOL 0.05 % EMUL Place 1 drop into the right eye daily.  0  . PHENYTOIN INFATABS 50 MG tablet chew 1 tablet at bedtime (Patient not taking: Reported on 11/24/2017) 90  tablet 1  . polyethylene glycol-electrolytes (NULYTELY/GOLYTELY) 420 g solution Take 4,000 mLs by mouth once.    . Suvorexant 15 MG TABS Take 15 mg by mouth daily.     No current facility-administered medications on file prior to visit.     There are no Patient Instructions on file for  this visit. No follow-ups on file.   Kris Hartmann, NP

## 2017-12-03 ENCOUNTER — Other Ambulatory Visit: Payer: Self-pay | Admitting: Neurology

## 2017-12-03 DIAGNOSIS — G40001 Localization-related (focal) (partial) idiopathic epilepsy and epileptic syndromes with seizures of localized onset, not intractable, with status epilepticus: Secondary | ICD-10-CM

## 2017-12-03 MED ORDER — LAMOTRIGINE 100 MG PO TABS: ORAL_TABLET | ORAL | 0 refills | Status: DC

## 2017-12-21 ENCOUNTER — Other Ambulatory Visit: Payer: Self-pay | Admitting: Adult Health

## 2017-12-21 ENCOUNTER — Other Ambulatory Visit: Payer: Self-pay | Admitting: Neurology

## 2017-12-21 DIAGNOSIS — G40001 Localization-related (focal) (partial) idiopathic epilepsy and epileptic syndromes with seizures of localized onset, not intractable, with status epilepticus: Secondary | ICD-10-CM

## 2018-01-04 ENCOUNTER — Encounter: Payer: Self-pay | Admitting: Neurology

## 2018-01-04 ENCOUNTER — Ambulatory Visit (INDEPENDENT_AMBULATORY_CARE_PROVIDER_SITE_OTHER): Payer: Medicare Other | Admitting: Neurology

## 2018-01-04 DIAGNOSIS — G40001 Localization-related (focal) (partial) idiopathic epilepsy and epileptic syndromes with seizures of localized onset, not intractable, with status epilepticus: Secondary | ICD-10-CM | POA: Diagnosis not present

## 2018-01-04 MED ORDER — SUVOREXANT 15 MG PO TABS: 15.0000 mg | ORAL_TABLET | Freq: Every day | ORAL | 2 refills | Status: DC

## 2018-01-04 MED ORDER — DILANTIN 100 MG PO CAPS: 200.0000 mg | ORAL_CAPSULE | Freq: Every day | ORAL | 0 refills | Status: DC

## 2018-01-04 MED ORDER — LAMOTRIGINE 100 MG PO TABS: ORAL_TABLET | ORAL | 0 refills | Status: DC

## 2018-01-04 MED ORDER — PHENYTOIN 50 MG PO CHEW: CHEWABLE_TABLET | ORAL | 0 refills | Status: DC

## 2018-01-04 NOTE — Progress Notes (Signed)
PATIENT: Sally Green DOB: November 16, 1945  REASON FOR VISIT: follow up HISTORY FROM: patient  HISTORY OF PRESENT ILLNESS:  HISTORY 11/09/12 [facility]): The patient was diagnosed with an AV  in 2008 by Dr. Claiborne Billings , Dr Mable Fill in [place].  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 She has been  followed here at [facility] and parallel by Dr. Tommi Rumps at [facility].  The patient's spells are described as sensory spells the typical symptom of the tingling in the fingertips or even a whole course of sometimes the duration is less than a minute sometimes a bottle minutes there has never been a visible consult and she has never suffered any injuries related to one of these spells,  she does not lose awareness or consciousness. We performed a  Full EEG -polysomnography in August 2012. There was no seizure activity seen in sleep and the patient was neither excessively daytime sleepy nor did she have any apnea.  There was also no hypoxia noted. In August 20 13 she presented again with sensory spells the tingling of the fingertips and in dorsal hands. At that time she averaged 2 -3 brief spells per month.  Her Dilantin level was therapeutic.  In January 2014, I increased the Lamictal doses in response to ongoing 2-3 seizures a month , paralleled to have decreased to one seizure a month for a brief period of time.  She takes Lamotrigine brand name Lamictal - 100 mg tablets 2 in the morning and 3 in the evening ( brand-name only but she could not longer afford this and switch to a generic). She also takes Dilantin brand-name 300 mg at night. She has always kept a seizure diary and she brings on today. On 05-14-12 her electrolytes were all  In normal limits,  her Dilantin level was 7.3 , and her GFR was normal. She is scheduled to have repaeat labs next week- before she sees Dr Tommi Rumps.  She have to spells in March one in April, 3 [place],  3 in June, 2 in July and one fit in August all of the duration of 1-2 minutes or less.  She reports that she has trouble speaking and finding words during this period , having  speech arrest.   Sally Green is a 72 year old female with a history of AVM and seizures. She returns today for follow-up. She is currently taking Dilantin 300 mg at bedtime and lamictal 100 mg . Her last seizure was on Nov. 9th. She has several seizures since her last visit. She states that her seizures consist of a tingling sensation down the body.  On average her seizures will last 1-1.5 minutes. She states that in the last few years the longest she has gone without a seizure is 3 weeks. She operates a Teacher, music without difficulty. She is able to complete all ADLs independently. No changes in her gait or balance. The patient does report that her husband passed away 4 weeks ago. She states that she is having a hard time sleeping. She states that she wakes up several times a night.   CD  revisit on 08-17-14 for this long-standing patient of mine. She endorsed today the Epworth sleepiness score at 5 points fatigue severity at 9 points and the depression scale geriatric depression scale at 1-2 points. The latter is remarkable because Sally Green lost her husband about 6 months ago. She now lives alone which has  been burdensome to her and she is also still not used to sleeping and living alone in a house she shared with her husband. She has family around and her grandson lives close by.   She reports her last seizure activity on a the third at about 7 AM she handed me a list of all seizures she has had in 2016.The medication has been unchanged, she states that she sleeps rather poorly. I would not be opposed to use a sleep aid for her as she has not had success with trying melatonin. Aware that sleep deprivation is also a seizure trigger. He has not been on antidepressants which may help her with her grieving . Her daughter was  diagnosed at age 62 with lung cancer of the left lung , and is undergoing chemotherapy . She has her third treatment tomorrow. This also worries her mother of course.  25th of October 2017, I reviewed Sally Green is a seizure diary and she is stable. We can refill at the current levels. I also will order a blood test for Dilantin and Lamictal level today. Ongoing seizure activity, which the patient describes as mini seizures, all  AVM related.  One in May 2017, 3 in June, 1 in July 3 in August, 2 in September and one in October. She has noticed some periodicity to her spells sometimes having 2 in a row and then time in between.   Interval history from 05/19/2016, I have pleasure of seeing Sally Green today in the company of her daughter. 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 Due to location the malformation is a resectable. She starts with a review of her seizure diary today: for the remainder of  2017 she had one seizure in October, one in November, 2 in December all lasting less than 2 minutes. In January she had 3 seizures, in February 1 as far as now. 250 mg at night of Dilantin, gets sleepy soon after. No side effect.  Lamictal well tolerated . She reports feeling worried , especially since her daughter is undergoing Chemotherapy. Diagnosed March 2016 with lung cancer.   01-04-2018 , Sally Green daughter 'Linus Orn' passed 04-25-2017 of lung cancer at age after a 50 year battle, age 47. She was single, never married , no children. Sally Green  is very much grieving. 3 years ago just lost her husband. She has 2 great grandchildren, born in February ( "Naida Sleight a girl)  and March (" Braxton" ,a boy ). Both living in [place]. No spells, but a lot of depression.    REVIEW OF SYSTEMS: Out of a complete 14 system review of symptoms, the patient complains only of the following symptoms, and all other reviewed  systems are negative.  Insomnia, depression, seizures. Anxiety - loss of husband 2016 and her daughter was diagnosed with lung cancer jan 2016.    ALLERGIES: Allergies  Allergen Reactions  . Depakote [Divalproex Sodium]     Nervous   . Aspirin     Instructed to never take ASA  . Keppra [Levetiracetam]     unknown  . Topamax [Topiramate]     unknown  . Guaifenesin Palpitations    HOME MEDICATIONS: Outpatient Medications Prior to Visit  Medication Sig Dispense Refill  . acetaminophen (TYLENOL) 500 MG tablet Take 500 mg by mouth daily as needed for moderate pain or headache.    . albuterol (PROVENTIL HFA;VENTOLIN HFA) 108 (90 Base) MCG/ACT inhaler  Inhale 2 puffs into the lungs every 6 (six) hours as needed for wheezing or shortness of breath.    Marland Kitchen alendronate (FOSAMAX) 70 MG tablet Take by mouth.    Marland Kitchen atorvastatin (LIPITOR) 40 MG tablet Take 40 mg by mouth every evening.   1  . DILANTIN 100 MG ER capsule TAKE 2 CAPSULES BY MOUTH AT BEDTIME 180 capsule 0  . DUREZOL 0.05 % EMUL Place 1 drop into the right eye daily.  0  . FLUoxetine (PROZAC) 10 MG capsule Take by mouth.    . lamoTRIgine (LAMICTAL) 100 MG tablet take 2 and 1/2 tablets by mouth every morning and take 3 tablets by mouth IN THE LATE AFTERNOON 495 tablet 0  . PHENYTOIN INFATABS 50 MG tablet CHEW 1 TABLET BY MOUTH AT BEDTIME 90 tablet 0  . Suvorexant 15 MG TABS Take 15 mg by mouth daily.    . polyethylene glycol-electrolytes (NULYTELY/GOLYTELY) 420 g solution Take 4,000 mLs by mouth once.    . Calcium Carbonate-Vitamin D 600-400 MG-UNIT tablet Take 1 tablet by mouth 2 (two) times daily.      No facility-administered medications prior to visit.     PAST MEDICAL HISTORY: Past Medical History:  Diagnosis Date  . 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 AVM (arteriovenous malformation)   . Cancer ([place]) 2017   lung  . Cerebral hemorrhage ([place])   . Convulsions ([place])   . Depression   . Fracture, foot    Right   .  History of colon polyps   . HOH (hard of hearing)    AIDS  . Hyperlipemia   . Hypersomnia   . Lung mass   . Menopausal symptoms   . 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 Osteoporosis, postmenopausal   . 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  Se[facility] dr Tommi [facility] at [facility]  Oct 2014.    . Seizures ([place])    LAST 9/18    PAST SURGICAL HISTORY: Past Surgical History:  Procedure Laterality Date  . ABDOMINAL HYSTERECTOMY    . BREAST BIOPSY Left 2004   neg  . CATARACT EXTRACTION W/PHACO Right 01/13/2017   Procedure: CATARACT EXTRACTION PHACO AND INTRAOCULAR LENS PLACEMENT (IOC);  Surgeon: Birder Robson, MD;  Location: [facility] ORS;  Service: Ophthalmology;  Laterality: Right;  [place]  00:49.3 AP%  14.7 CDE   7.28 Fluid pack lot # [id number]  . CATARACT EXTRACTION W/PHACO Left 02/03/2017   Procedure: CATARACT EXTRACTION PHACO AND INTRAOCULAR LENS PLACEMENT (IOC);  Surgeon: Birder Robson, MD;  Location: [facility] ORS;  Service: Ophthalmology;  Laterality: Left;  [place] 00:41 AP% 14.3 CDE 6.01 Fluid pack lot # [id number]  . COLONOSCOPY WITH PROPOFOL N/A 04/10/2017   Procedure: COLONOSCOPY WITH PROPOFOL;  Surgeon: Manya Silvas, MD;  Location: [facility] ENDOSCOPY;  Service: Endoscopy;  Laterality: N/A;  . dental implants    . ELECTROMAGNETIC NAVIGATION BROCHOSCOPY N/A 03/07/2015   Procedure: ELECTROMAGNETIC NAVIGATION BRONCHOSCOPY;  Surgeon: Flora Lipps, MD;  Location: [facility] ORS;  Service: Cardiopulmonary;  Laterality: N/A;  . EYE SURGERY    . oophrectomy    . SHOULDER SURGERY Left 2007  . VAGINAL DELIVERY     3 children    FAMILY HISTORY: Family History  Problem Relation Age of Onset  . Lymphoma Mother   . Alzheimer's disease Father   . Aneurysm Father   . Headache Father   . Cancer Other   .  Aneurysm Paternal Uncle   . Headache Sally   . Cancer Sally        lung  .  Cancer Maternal Aunt   . Breast cancer Maternal Aunt   . Cancer Paternal Aunt   . Leukemia Maternal Uncle     SOCIAL HISTORY: Social History   Socioeconomic History  . Marital status: Widowed    Spouse name: Sally Green  . Number of children: 3  . Years of education: 35  . Highest education level: Not on file  Occupational History  . Occupation: retired  Scientific laboratory technician  . Financial resource strain: Not on file  . Food insecurity:    Worry: Not on file    Inability: Not on file  . Transportation needs:    Medical: Not on file    Non-medical: Not on file  Tobacco Use  . Smoking status: Former Smoker    Packs/day: 1.25    Years: 50.00    Pack years: 62.50    Types: Cigarettes    Last attempt to quit: 06/24/2013    Years since quitting: 4.5  . Smokeless tobacco: Never Used  Substance and Sexual Activity  . Alcohol use: No  . Drug use: No  . Sexual activity: Not on file  Lifestyle  . Physical activity:    Days per week: Not on file    Minutes per session: Not on file  . Stress: Not on file  Relationships  . Social connections:    Talks on phone: Not on file    Gets together: Not on file    Attends religious service: Not on file    Active member of club or organization: Not on file    Attends meetings of clubs or organizations: Not on file    Relationship status: Not on file  . Intimate partner violence:    Fear of current or ex partner: Not on file    Emotionally abused: Not on file    Physically abused: Not on file    Forced sexual activity: Not on file  Other Topics Concern  . Not on file  Social History Narrative   Patient is married (Sally Green) and lives at home with her husband.   Patient has three children.   Patient is retired.   Patient has a high school education.   Patient is right-handed.   Consumes caffeine rarely.    PHYSICAL EXAM  Vitals:   01/04/18 1320  BP: (!) 146/64  Pulse: 74  Weight: 124 lb (56.2 kg)  Height: 5\' 4"  (1.626 m)   Body mass index  is 21.28 kg/m.  Generalized: Well developed, in no acute distress , groomed . notably bluish finger discoloration.  Cold fingers- Raynaud's. Was a long term smoker.  Very easily bruising.   Neurological examination is unchanged.  The patient is hoarse.   Fluent speech, alert , taste and smell intact.  Pupils equal, hearing intact,  full extraocular movements,  intact to finger perimetry . The uvula and tongue are moving in midline.  Normal strength tone in bilateral equal grip strengths,  normal deep tendon reflexes, symmetrically. Sensory to fine touch intact.  Finger to nose with action tremor. Gait is not ataxic.     DIAGNOSTIC DATA (LABS, IMAGING, TESTING) - I reviewed patient records, labs, notes, testing and imaging myself where available.  I do need to order new labs.   She sees her Jefm Bryant PCP each April and October.   I will ask to have CMET, CBC, Dilantin  and Lamictal level.  ASSESSMENT AND PLAN 72 y.o. 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 here with:  1. The patient is currently taking Dilantin 250 mg at bedtime and will continue.  and Lamictal 100 mg 2. 5  tablets in the morning and 3 tablets in the evening. Started Zoloft 50 mg in AM, continue Vit D and calcium and Lipitor.     CMET and CBC w diff.   The patient will follow up in 6 months with NP or sooner if needed. She is actively grieving the recent death of her daughter.    Larey Seat, MD    01/04/2018, 1:48 PM [facility] [address] [place], [place] [address] [phone]

## 2018-01-05 ENCOUNTER — Telehealth: Payer: Self-pay | Admitting: Neurology

## 2018-01-05 LAB — CBC WITH DIFFERENTIAL/PLATELET
BASOS ABS: 0 10*3/uL (ref 0.0–0.2)
Basos: 1 %
EOS (ABSOLUTE): 0.1 10*3/uL (ref 0.0–0.4)
EOS: 2 %
HEMATOCRIT: 42.1 % (ref 34.0–46.6)
Hemoglobin: 14.3 g/dL (ref 11.1–15.9)
IMMATURE GRANULOCYTES: 0 %
Immature Grans (Abs): 0 10*3/uL (ref 0.0–0.1)
LYMPHS ABS: 2.4 10*3/uL (ref 0.7–3.1)
Lymphs: 42 %
MCH: 31.6 pg (ref 26.6–33.0)
MCHC: 34 g/dL (ref 31.5–35.7)
MCV: 93 fL (ref 79–97)
MONOS ABS: 0.8 10*3/uL (ref 0.1–0.9)
Monocytes: 13 %
NEUTROS PCT: 42 %
Neutrophils Absolute: 2.4 10*3/uL (ref 1.4–7.0)
PLATELETS: 196 10*3/uL (ref 150–450)
RBC: 4.52 x10E6/uL (ref 3.77–5.28)
RDW: 13.4 % (ref 12.3–15.4)
WBC: 5.8 10*3/uL (ref 3.4–10.8)

## 2018-01-05 LAB — COMPREHENSIVE METABOLIC PANEL
A/G RATIO: 2 (ref 1.2–2.2)
ALK PHOS: 114 IU/L (ref 39–117)
ALT: 10 IU/L (ref 0–32)
AST: 16 IU/L (ref 0–40)
Albumin: 4.1 g/dL (ref 3.5–4.8)
BILIRUBIN TOTAL: 0.2 mg/dL (ref 0.0–1.2)
BUN / CREAT RATIO: 19 (ref 12–28)
BUN: 13 mg/dL (ref 8–27)
CALCIUM: 9.8 mg/dL (ref 8.7–10.3)
CHLORIDE: 102 mmol/L (ref 96–106)
CO2: 29 mmol/L (ref 20–29)
Creatinine, Ser: 0.68 mg/dL (ref 0.57–1.00)
GFR calc Af Amer: 100 mL/min/{1.73_m2} (ref >59)
GFR calc non Af Amer: 87 mL/min/{1.73_m2} (ref >59)
GLOBULIN, TOTAL: 2.1 g/dL (ref 1.5–4.5)
Glucose: 78 mg/dL (ref 65–99)
POTASSIUM: 4.4 mmol/L (ref 3.5–5.2)
SODIUM: 146 mmol/L — AB (ref 134–144)
TOTAL PROTEIN: 6.2 g/dL (ref 6.0–8.5)

## 2018-01-05 NOTE — Telephone Encounter (Signed)
-----   Message from Larey Seat, MD sent at 01/05/2018  8:24 AM EDT ----- Slight increase in sodium- mild hypernatremia. This is not causing cognitive complications. Her gait stability should not be affected.

## 2018-01-05 NOTE — Telephone Encounter (Signed)
Called the patient and advised her of her lab work. Informed her that her sodium was just slightly increased but this was not of any concern with causing any problems with cognitive or gait complications. Informed her all other labs looked goos and Dr Dohmeier had no concerns Pt verbalized understanding. Pt had no questions at this time but was encouraged to call back if questions arise.

## 2018-02-22 ENCOUNTER — Other Ambulatory Visit: Payer: Self-pay | Admitting: *Deleted

## 2018-02-22 DIAGNOSIS — C3411 Malignant neoplasm of upper lobe, right bronchus or lung: Secondary | ICD-10-CM

## 2018-03-03 ENCOUNTER — Ambulatory Visit
Admission: RE | Admit: 2018-03-03 | Discharge: 2018-03-03 | Disposition: A | Discharge status: Home / Self Care | Payer: Medicare Other | Source: Ambulatory Visit | Attending: Radiation Oncology | Admitting: Radiation Oncology

## 2018-03-03 DIAGNOSIS — C3411 Malignant neoplasm of upper lobe, right bronchus or lung: Secondary | ICD-10-CM

## 2018-03-03 LAB — POCT I-STAT CREATININE: Creatinine, Ser: 0.6 mg/dL (ref 0.44–1.00)

## 2018-03-03 MED ORDER — IOHEXOL 300 MG/ML  SOLN
75.0000 mL | Freq: Once | INTRAMUSCULAR | Status: AC | PRN
  Administered 2018-03-03: 75 mL via INTRAVENOUS

## 2018-03-05 ENCOUNTER — Ambulatory Visit
Admission: RE | Admit: 2018-03-05 | Discharge: 2018-03-05 | Disposition: A | Discharge status: Home / Self Care | Payer: Medicare Other | Source: Ambulatory Visit | Attending: Radiation Oncology | Admitting: Radiation Oncology

## 2018-03-05 ENCOUNTER — Encounter: Payer: Self-pay | Admitting: Radiation Oncology

## 2018-03-05 ENCOUNTER — Other Ambulatory Visit: Payer: Self-pay

## 2018-03-05 ENCOUNTER — Other Ambulatory Visit: Payer: Self-pay | Admitting: *Deleted

## 2018-03-05 DIAGNOSIS — Z923 Personal history of irradiation: Secondary | ICD-10-CM | POA: Insufficient documentation

## 2018-03-05 DIAGNOSIS — C3411 Malignant neoplasm of upper lobe, right bronchus or lung: Secondary | ICD-10-CM

## 2018-03-05 DIAGNOSIS — Z87891 Personal history of nicotine dependence: Secondary | ICD-10-CM | POA: Diagnosis not present

## 2018-03-05 NOTE — Progress Notes (Signed)
Radiation Oncology Follow up Note  Name: Sally Green   Date:   03/05/2018 MRN:  [id number] DOB: 09/29/1945    This 72 y.o. 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 Quite depressed from loss of her daughter previous year from cancer. She specifically denies cough hemoptysis chest tightness or weight loss. Recently had a CT scan showing interval increase in confluent and nodular soft tissue in the right upper lobe. Progression is seen along anterior medial margin which may reflect recurrent or persistent disease.  COMPLICATIONS OF TREATMENT: none  FOLLOW UP COMPLIANCE: keeps appointments   PHYSICAL EXAM:  BP (P) 138/68 (BP Location: Left Arm, Patient Position: Sitting)   Pulse (P) 79   Temp (P) 98 F (36.7 C) (Tympanic)   Wt (P) 123 lb 3.8 oz (55.9 kg)   BMI (P) 21.15 kg/m  Well-developed well-nourished patient in NAD. HEENT reveals PERLA, EOMI, discs not visualized.  Oral cavity is clear. No oral mucosal lesions are identified. Neck is clear without evidence of cervical or supraclavicular adenopathy. Lungs are clear to A&P. Cardiac examination is essentially unremarkable with regular rate and rhythm without murmur rub or thrill. Abdomen is benign with no organomegaly or masses noted. Motor sensory and DTR levels are equal and symmetric in the upper and lower extremities. Cranial nerves II through XII are grossly intact. Proprioception is intact. No peripheral adenopathy or edema is identified. No motor or sensory levels are noted. Crude visual fields are within normal range.  RADIOLOGY RESULTS: CT scan reviewed compatible with the above-stated findings  PLAN: I've ordered a PET CT scan for further  clarification of the right upper lobe. Should this be hypermetabolic would order a CT-guided biopsy of this area. Should this be proven recurrent or persistent disease could offer salvage radiation therapy using again SB RT treatment. All of this was discussed with the patient. Will review her PET CT scan and make final determination on proceeding with biopsy. Patient Has my treatment plan well.  I would like to take this opportunity to thank you for allowing me to participate in the care of your patient.Noreene Filbert, MD

## 2018-03-26 ENCOUNTER — Ambulatory Visit
Admission: RE | Admit: 2018-03-26 | Discharge: 2018-03-26 | Disposition: A | Discharge status: Home / Self Care | Payer: Medicare Other | Source: Ambulatory Visit | Attending: Radiation Oncology | Admitting: Radiation Oncology

## 2018-03-26 DIAGNOSIS — J984 Other disorders of lung: Secondary | ICD-10-CM | POA: Insufficient documentation

## 2018-03-26 DIAGNOSIS — Q859 Phakomatosis, unspecified: Secondary | ICD-10-CM | POA: Insufficient documentation

## 2018-03-26 DIAGNOSIS — Y842 Radiological procedure and radiotherapy as the cause of abnormal reaction of the patient, or of later complication, without mention of misadventure at the time of the procedure: Secondary | ICD-10-CM | POA: Diagnosis not present

## 2018-03-26 DIAGNOSIS — J8410 Pulmonary fibrosis, unspecified: Secondary | ICD-10-CM | POA: Insufficient documentation

## 2018-03-26 DIAGNOSIS — C3411 Malignant neoplasm of upper lobe, right bronchus or lung: Secondary | ICD-10-CM | POA: Insufficient documentation

## 2018-03-26 DIAGNOSIS — J439 Emphysema, unspecified: Secondary | ICD-10-CM | POA: Insufficient documentation

## 2018-03-26 LAB — GLUCOSE, CAPILLARY: Glucose-Capillary: 78 mg/dL (ref 70–99)

## 2018-03-26 MED ORDER — FLUDEOXYGLUCOSE F - 18 (FDG) INJECTION
6.6200 | Freq: Once | INTRAVENOUS | Status: AC | PRN
  Administered 2018-03-26: 6.62 via INTRAVENOUS

## 2018-05-18 ENCOUNTER — Telehealth: Payer: Self-pay | Admitting: Neurology

## 2018-05-18 NOTE — Telephone Encounter (Signed)
Pt called back and has r/s for 5/19 with Ward Givens, NP. Pt states she is stable at the time.

## 2018-05-18 NOTE — Telephone Encounter (Signed)
Called the patient to discuss upcoming 4/8 apt. There was no answer LVM informing the patient that Dr Brett Fairy will be out of the office on 07/07/2018. Patient was last seen on 12/2017. LVM informing the patient I will cancel the apt for 07/07/2018 and the patient will need to call back to reschedule. The apt can be with either Dr Brett Fairy or Ward Givens, NP. The apt can be pushed out as long as she is stable and doesn't have any concerns. We just need to see her between may and Oct 2020. The apt can be with NP or MD whoever has first available. LVM with all this information and that I would be taking her off the 4/8.

## 2018-05-26 ENCOUNTER — Other Ambulatory Visit: Payer: Self-pay | Admitting: Internal Medicine

## 2018-05-26 DIAGNOSIS — Z1231 Encounter for screening mammogram for malignant neoplasm of breast: Secondary | ICD-10-CM

## 2018-06-06 ENCOUNTER — Other Ambulatory Visit: Payer: Self-pay | Admitting: Neurology

## 2018-06-06 DIAGNOSIS — G40001 Localization-related (focal) (partial) idiopathic epilepsy and epileptic syndromes with seizures of localized onset, not intractable, with status epilepticus: Secondary | ICD-10-CM

## 2018-06-07 ENCOUNTER — Other Ambulatory Visit: Payer: Self-pay | Admitting: Neurology

## 2018-06-07 DIAGNOSIS — G40001 Localization-related (focal) (partial) idiopathic epilepsy and epileptic syndromes with seizures of localized onset, not intractable, with status epilepticus: Secondary | ICD-10-CM

## 2018-06-11 ENCOUNTER — Other Ambulatory Visit: Payer: Self-pay | Admitting: Neurology

## 2018-06-11 DIAGNOSIS — G40001 Localization-related (focal) (partial) idiopathic epilepsy and epileptic syndromes with seizures of localized onset, not intractable, with status epilepticus: Secondary | ICD-10-CM

## 2018-07-07 ENCOUNTER — Ambulatory Visit: Payer: Medicare Other | Admitting: Neurology

## 2018-08-12 ENCOUNTER — Telehealth: Payer: Self-pay

## 2018-08-12 NOTE — Telephone Encounter (Signed)
Spoke with the patient and they have given verbal consent to file their insurance and to do a doxy.me visit. Mobile number and carrier have been confirmed and sent.   Text: [phone] Youth worker)

## 2018-08-17 ENCOUNTER — Other Ambulatory Visit: Payer: Self-pay

## 2018-08-17 ENCOUNTER — Ambulatory Visit (INDEPENDENT_AMBULATORY_CARE_PROVIDER_SITE_OTHER): Payer: Medicare Other | Admitting: Adult Health

## 2018-08-17 DIAGNOSIS — G40001 Localization-related (focal) (partial) idiopathic epilepsy and epileptic syndromes with seizures of localized onset, not intractable, with status epilepticus: Secondary | ICD-10-CM

## 2018-08-17 MED ORDER — LAMOTRIGINE 100 MG PO TABS: ORAL_TABLET | ORAL | 3 refills | Status: DC

## 2018-08-17 MED ORDER — PHENYTOIN 50 MG PO CHEW: CHEWABLE_TABLET | ORAL | 3 refills | Status: DC

## 2018-08-17 MED ORDER — DILANTIN 100 MG PO CAPS: ORAL_CAPSULE | ORAL | 3 refills | Status: DC

## 2018-08-17 NOTE — Progress Notes (Signed)
  [facility] [address] [place]. [place] [address] [phone]     Virtual Visit via Telephone Note  I connected with Sally Green on 08/17/18 at  3:00 PM EDT by telephone located remotely at [facility] and verified that I am speaking with the correct person using two identifiers who reports being located at her home.    I discussed the limitations, risks, security and privacy concerns of performing an evaluation and management service by telephone and the availability of in person appointments. I also discussed with the patient that there may be a patient responsible charge related to this service. The patient expressed understanding and agreed to proceed. See telephone note for consent and additional scheduling information.    History of Present Illness:  Sally Green is a 73 y.o. female who has been followed in this office for seizures. She was initially scheduled for face-to-face office follow up visit today time but due to [place], visit rescheduled for non-face-to-face telephone visit with patients consent. Unable to participate in video visit due to lack of access to device with camera.  Ms. Manukyan is a 73 year old female with a history of seizures. She returns today for a telephone visit. States that her seizures have remained under control. She has a seizure about every 2-6 weeks. Her seizures start with a tingling down the center of the body and then she has trouble speaking after. It can last for 1.5-2 minutes. Her last two was 4/20 and 5/19. She continues on Lamictal and Dilantin. She denies any new issues.       Observations/Objective:  Generalized: Well developed, in no acute distress   Neurological examination  Mentation: Alert oriented to time, place, history taking.  speech and language fluent  Assessment and Plan:  1. Seizures   Continue Lamictal and Dilantin. We will check blood work at the next visit. Patient does  not feel comfortable coming in d/t to COVID 19. Advised that if her symptoms worsen or she develops new symptoms she should let us know.  She will follow-up in 6 months or sooner if needed.  Follow Up Instructions: Follow-up in 6 months      I discussed the assessment and treatment plan with the patient.  The patient was provided an opportunity to ask questions and all were answered to their satisfaction. The patient agreed with the plan and verbalized an understanding of the instructions.   I provided15 minutes of non-face-to-face time during this encounter.    Ward Givens, NP-C  [facility] [address] [place], [place] [address]  Phone [phone] Fax [phone] Note: This document was prepared with digital dictation and possible smart phrase technology. Any transcriptional errors that result from this process are unintentional.

## 2018-09-22 ENCOUNTER — Other Ambulatory Visit: Payer: Self-pay | Admitting: *Deleted

## 2018-09-22 DIAGNOSIS — C3411 Malignant neoplasm of upper lobe, right bronchus or lung: Secondary | ICD-10-CM

## 2018-10-05 ENCOUNTER — Ambulatory Visit
Admission: RE | Admit: 2018-10-05 | Discharge: 2018-10-05 | Disposition: A | Discharge status: Home / Self Care | Payer: Medicare Other | Source: Ambulatory Visit | Attending: Radiation Oncology | Admitting: Radiation Oncology

## 2018-10-05 ENCOUNTER — Other Ambulatory Visit: Payer: Self-pay

## 2018-10-05 DIAGNOSIS — C3411 Malignant neoplasm of upper lobe, right bronchus or lung: Secondary | ICD-10-CM | POA: Diagnosis not present

## 2018-10-05 LAB — POCT I-STAT CREATININE: Creatinine, Ser: 0.6 mg/dL (ref 0.44–1.00)

## 2018-10-05 MED ORDER — IOHEXOL 300 MG/ML  SOLN
60.0000 mL | Freq: Once | INTRAMUSCULAR | Status: AC | PRN
  Administered 2018-10-05: 60 mL via INTRAVENOUS

## 2018-10-13 ENCOUNTER — Ambulatory Visit
Admission: RE | Admit: 2018-10-13 | Discharge: 2018-10-13 | Disposition: A | Discharge status: Home / Self Care | Payer: Medicare Other | Source: Ambulatory Visit | Attending: Radiation Oncology | Admitting: Radiation Oncology

## 2018-10-13 ENCOUNTER — Encounter: Payer: Self-pay | Admitting: Radiation Oncology

## 2018-10-13 ENCOUNTER — Other Ambulatory Visit: Payer: Self-pay | Admitting: *Deleted

## 2018-10-13 ENCOUNTER — Other Ambulatory Visit: Payer: Self-pay

## 2018-10-13 VITALS — BP 159/67 | HR 78 | Temp 97.6°F | Wt 121.9 lb

## 2018-10-13 DIAGNOSIS — Z87891 Personal history of nicotine dependence: Secondary | ICD-10-CM | POA: Diagnosis not present

## 2018-10-13 DIAGNOSIS — Z923 Personal history of irradiation: Secondary | ICD-10-CM | POA: Diagnosis not present

## 2018-10-13 DIAGNOSIS — C3411 Malignant neoplasm of upper lobe, right bronchus or lung: Secondary | ICD-10-CM

## 2018-10-13 DIAGNOSIS — Z85118 Personal history of other malignant neoplasm of bronchus and lung: Secondary | ICD-10-CM | POA: Diagnosis not present

## 2018-10-13 NOTE — Progress Notes (Signed)
CT

## 2018-10-13 NOTE — Progress Notes (Signed)
Radiation Oncology Follow up Note  Name: Sally Green   Date:   10/13/2018 MRN:  [id number] DOB: 08-24-1945    This 73 y.o. female presents to the clinic today for 3-1/2-year follow-up status post SBRT to right upper lobe for squamous cell carcinoma stage I.  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  Most recent CT scan which I have reviewed shows continued mild interval increase in thickening of the post radiation changes of the right upper lobe with recommendation for continued follow-up.  She is scheduled for another CT scan in a year.  COMPLICATIONS OF TREATMENT: none  FOLLOW UP COMPLIANCE: keeps appointments   PHYSICAL EXAM:  BP (!) 159/67   Pulse 78   Temp 97.6 F (36.4 C)   Wt 121 lb 14.6 oz (55.3 kg)   BMI 20.93 kg/m  Well-developed well-nourished patient in NAD. HEENT reveals PERLA, EOMI, discs not visualized.  Oral cavity is clear. No oral mucosal lesions are identified. Neck is clear without evidence of cervical or supraclavicular adenopathy. Lungs are clear to A&P. Cardiac examination is essentially unremarkable with regular rate and rhythm without murmur rub or thrill. Abdomen is benign with no organomegaly or masses noted. Motor sensory and DTR levels are equal and symmetric in the upper and lower extremities. Cranial nerves II through XII are grossly intact. Proprioception is intact. No peripheral adenopathy or edema is identified. No motor or sensory levels are noted. Crude visual fields are within normal range.  RADIOLOGY RESULTS: Serial CT scans reviewed and compatible with above-stated findings  PLAN: Present time patient is doing well 3-1/2 years out from SBRT.  And pleased with her overall progress.  We will continue  close observation with a follow-up CT scan in a year.  Patient knows to call sooner with any concerns.  I would like to take this opportunity to thank you for allowing me to participate in the care of your patient.Noreene Filbert, MD

## 2018-11-16 ENCOUNTER — Ambulatory Visit
Admission: RE | Admit: 2018-11-16 | Discharge: 2018-11-16 | Disposition: A | Discharge status: Home / Self Care | Payer: Medicare Other | Source: Ambulatory Visit | Attending: Internal Medicine | Admitting: Internal Medicine

## 2018-11-16 ENCOUNTER — Other Ambulatory Visit: Payer: Self-pay

## 2018-11-16 DIAGNOSIS — Z1231 Encounter for screening mammogram for malignant neoplasm of breast: Secondary | ICD-10-CM | POA: Diagnosis not present

## 2019-02-15 ENCOUNTER — Ambulatory Visit (INDEPENDENT_AMBULATORY_CARE_PROVIDER_SITE_OTHER): Payer: Medicare Other | Admitting: Adult Health

## 2019-02-15 ENCOUNTER — Other Ambulatory Visit: Payer: Self-pay

## 2019-02-15 ENCOUNTER — Encounter: Payer: Self-pay | Admitting: Adult Health

## 2019-02-15 VITALS — BP 162/74 | HR 75 | Temp 98.0°F | Ht 64.0 in | Wt 120.6 lb

## 2019-02-15 DIAGNOSIS — G40001 Localization-related (focal) (partial) idiopathic epilepsy and epileptic syndromes with seizures of localized onset, not intractable, with status epilepticus: Secondary | ICD-10-CM | POA: Diagnosis not present

## 2019-02-15 DIAGNOSIS — Z5181 Encounter for therapeutic drug level monitoring: Secondary | ICD-10-CM | POA: Diagnosis not present

## 2019-02-15 NOTE — Progress Notes (Signed)
PATIENT: Sally Green DOB: Aug 20, 1945  REASON FOR VISIT: follow up HISTORY FROM: patient  HISTORY OF PRESENT ILLNESS: Today 02/15/19: Sally Green is a 73 year old female with a history of seizures.  She returns today for follow-up.  She states that her seizures have remained stable.  She has approximately 1 seizure a month.  On occasion she may have 2 events.  Her seizures have remained the same.  They start with a tingling down the center of her body and that she has trouble speaking.  These episodes typically are very brief.  Her last 2 seizures was October 9 and November 4.  She remains on Lamictal and Dilantin.   HISTORY 08/17/18 Sally Green is a 73 year old female with a history of seizures. She returns today for a telephone visit. States that her seizures have remained under control. She has a seizure about every 2-6 weeks. Her seizures start with a tingling down the center of the body and then she has trouble speaking after. It can last for 1.5-2 minutes. Her last two was 4/20 and 5/19. She continues on Lamictal and Dilantin. She denies any new issues.    REVIEW OF SYSTEMS: Out of a complete 14 system review of symptoms, the patient complains only of the following symptoms, and all other reviewed systems are negative.  See HPI  ALLERGIES: Allergies  Allergen Reactions  . Depakote [Divalproex Sodium]     Nervous   . Aspirin     Instructed to never take ASA  . Keppra [Levetiracetam]     unknown  . Topamax [Topiramate]     unknown  . Guaifenesin Palpitations    HOME MEDICATIONS: Outpatient Medications Prior to Visit  Medication Sig Dispense Refill  . acetaminophen (TYLENOL) 500 MG tablet Take 500 mg by mouth daily as needed for moderate pain or headache.    . albuterol (PROVENTIL HFA;VENTOLIN HFA) 108 (90 Base) MCG/ACT inhaler Inhale 2 puffs into the lungs every 6 (six) hours as needed for wheezing or shortness of breath.    Marland Kitchen alendronate (FOSAMAX) 70 MG tablet Take by  mouth.    Marland Kitchen atorvastatin (LIPITOR) 40 MG tablet Take 40 mg by mouth every evening.   1  . DILANTIN 100 MG ER capsule TAKE 2 CAPSULES(200 MG) BY MOUTH AT BEDTIME 180 capsule 3  . lamoTRIgine (LAMICTAL) 100 MG tablet Take 2.5 tabs in the AM, and 3 tabs in the afternoon. 495 tablet 3  . phenytoin (PHENYTOIN INFATABS) 50 MG tablet CHEW AND SWALLOW 1 TABLET BY MOUTH AT BEDTIME 90 tablet 3  . Calcium Carbonate-Vitamin D 600-400 MG-UNIT tablet Take 1 tablet by mouth 2 (two) times daily.     . DUREZOL 0.05 % EMUL Place 1 drop into the right eye daily.  0  . FLUoxetine (PROZAC) 10 MG capsule Take by mouth.    . Suvorexant 15 MG TABS Take 15 mg by mouth daily. 30 tablet 2   No facility-administered medications prior to visit.     PAST MEDICAL HISTORY: Past Medical History:  Diagnosis Date  . 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 AVM (arteriovenous malformation)   . Cancer ([place]) 2017   lung  . Cerebral hemorrhage ([place])   . Convulsions ([place])   . Depression   . Fracture, foot    Right   . History of colon polyps   . HOH (hard of hearing)    AIDS  . Hyperlipemia   . Hypersomnia   . Lung mass   .  Menopausal symptoms   . 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 Osteoporosis, postmenopausal   . 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  [facility] Tommi [facility]  Oct [facility].    . Seizures ([place])    LAST 9/18    PAST SURGICAL HISTORY: Past Surgical History:  Procedure Laterality Date  . ABDOMINAL HYSTERECTOMY    . BREAST BIOPSY Left 2004   neg  . CATARACT EXTRACTION W/PHACO Right 01/13/2017   Procedure: CATARACT EXTRACTION PHACO AND INTRAOCULAR LENS PLACEMENT (IOC);  Surgeon: Birder Robson, MD;  Location: [facility] ORS;  Service: Ophthalmology;  Laterality: Right;  [place]  00:49.3 AP%  14.7 CDE   7.28 Fluid pack lot # [id number]  . CATARACT EXTRACTION W/PHACO Left  02/03/2017   Procedure: CATARACT EXTRACTION PHACO AND INTRAOCULAR LENS PLACEMENT (IOC);  Surgeon: Birder Robson, MD;  Location: [facility] ORS;  Service: Ophthalmology;  Laterality: Left;  [place] 00:41 AP% 14.3 CDE 6.01 Fluid pack lot # [id number]  . COLONOSCOPY WITH PROPOFOL N/A 04/10/2017   Procedure: COLONOSCOPY WITH PROPOFOL;  Surgeon: Manya Silvas, MD;  Location: [facility] ENDOSCOPY;  Service: Endoscopy;  Laterality: N/A;  . dental implants    . ELECTROMAGNETIC NAVIGATION BROCHOSCOPY N/A 03/07/2015   Procedure: ELECTROMAGNETIC NAVIGATION BRONCHOSCOPY;  Surgeon: Flora Lipps, MD;  Location: [facility] ORS;  Service: Cardiopulmonary;  Laterality: N/A;  . EYE SURGERY    . oophrectomy    . SHOULDER SURGERY Left 2007  . VAGINAL DELIVERY     3 children    FAMILY HISTORY: Family History  Problem Relation Age of Onset  . Lymphoma Mother   . Alzheimer's disease Father   . Aneurysm Father   . Headache Father   . Cancer Other   . Aneurysm Paternal Uncle   . Headache Sister   . Cancer Sister        lung  . Cancer Maternal Aunt   . Breast cancer Maternal Aunt   . Cancer Paternal Aunt   . Leukemia Maternal Uncle     SOCIAL HISTORY: Social History   Socioeconomic History  . Marital status: Widowed    Spouse name: [name]  . Number of children: 3  . Years of education: 43  . Highest education level: Not on file  Occupational History  . Occupation: retired  Scientific laboratory technician  . Financial resource strain: Not on file  . Food insecurity    Worry: Not on file    Inability: Not on file  . Transportation needs    Medical: Not on file    Non-medical: Not on file  Tobacco Use  . Smoking status: Former Smoker    Packs/day: 1.25    Years: 50.00    Pack years: 62.50    Types: Cigarettes    Quit date: 06/24/2013    Years since quitting: 5.6  . Smokeless tobacco: Never Used  Substance and Sexual Activity  . Alcohol use: No  . Drug use: No  . Sexual activity: Not on file  Lifestyle  . Physical  activity    Days per week: Not on file    Minutes per session: Not on file  . Stress: Not on file  Relationships  . Social Herbalist on phone: Not on file    Gets together: Not on file    Attends religious service: Not on file    Active member of club or organization: Not  on file    Attends meetings of clubs or organizations: Not on file    Relationship status: Not on file  . Intimate partner violence    Fear of current or ex partner: Not on file    Emotionally abused: Not on file    Physically abused: Not on file    Forced sexual activity: Not on file  Other Topics Concern  . Not on file  Social History Narrative   Patient is married ([name]) and lives at home with her husband.   Patient has three children.   Patient is retired.   Patient has a high school education.   Patient is right-handed.   Consumes caffeine rarely.      PHYSICAL EXAM  Vitals:   02/15/19 0928  BP: (!) 162/74  Pulse: 75  Temp: 98 F (36.7 C)  Weight: 120 lb 9.6 oz (54.7 kg)  Height: 5\' 4"  (1.626 m)   Body mass index is 20.7 kg/m.  Generalized: Well developed, in no acute distress   Neurological examination  Mentation: Alert oriented to time, place, history taking. Follows all commands speech and language fluent Cranial nerve II-XII: Pupils were equal round reactive to light. Extraocular movements were full, visual field were full on confrontational test.  Head turning and shoulder shrug  were normal and symmetric. Motor: The motor testing reveals 5 over 5 strength of all 4 extremities. Good symmetric motor tone is noted throughout.  Sensory: Sensory testing is intact to soft touch on all 4 extremities. No evidence of extinction is noted.  Coordination: Cerebellar testing reveals good finger-nose-finger and heel-to-shin bilaterally.  Gait and station: Gait is normal.  Reflexes: Deep tendon reflexes are symmetric and normal bilaterally.   DIAGNOSTIC DATA (LABS, IMAGING, TESTING) - I  reviewed patient records, labs, notes, testing and imaging myself where available.  Lab Results  Component Value Date   WBC 5.8 01/04/2018   HGB 14.3 01/04/2018   HCT 42.1 01/04/2018   MCV 93 01/04/2018   PLT 196 01/04/2018      Component Value Date/Time   NA 146 (H) 01/04/2018 1420   NA 143 06/09/2013 1013   K 4.4 01/04/2018 1420   K 3.5 06/09/2013 1013   CL 102 01/04/2018 1420   CL 104 06/09/2013 1013   CO2 29 01/04/2018 1420   CO2 32 06/09/2013 1013   GLUCOSE 78 01/04/2018 1420   GLUCOSE 91 02/27/2015 1029   GLUCOSE 72 06/09/2013 1013   BUN 13 01/04/2018 1420   BUN 12 06/09/2013 1013   CREATININE 0.60 10/05/2018 1136   CREATININE 0.72 06/09/2013 1013   CALCIUM 9.8 01/04/2018 1420   CALCIUM 8.8 06/09/2013 1013   PROT 6.2 01/04/2018 1420   PROT 6.8 06/09/2013 1013   ALBUMIN 4.1 01/04/2018 1420   ALBUMIN 3.3 (L) 06/09/2013 1013   AST 16 01/04/2018 1420   AST 17 06/09/2013 1013   ALT 10 01/04/2018 1420   ALT 16 06/09/2013 1013   ALKPHOS 114 01/04/2018 1420   ALKPHOS 109 06/09/2013 1013   BILITOT 0.2 01/04/2018 1420   BILITOT 0.3 06/09/2013 1013   GFRNONAA 87 01/04/2018 1420   GFRNONAA >60 06/09/2013 1013   GFRAA 100 01/04/2018 1420   GFRAA >60 06/09/2013 1013     ASSESSMENT AND PLAN 73 y.o. 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 here with:  1.  Seizures  -Continue Dilantin 100 mg 2 tablets at bedtime -Continue Lamictal 100 mg-2-1/2 tablets in the morning and 3 tablets at bedtime  -Advised if seizure frequency increases she should let us know -Follow-up in 1 year   I spent 15 minutes with the patient. 50% of this time was spent discussing plan of care   Ward Givens, MSN, NP-C 02/15/2019, 10:20 AM [facility] [address], [place], [place] [address] [phone]

## 2019-02-15 NOTE — Patient Instructions (Signed)
Continue lamcital and Dilantin Blood work today If you have any seizure events please let us know.

## 2019-02-18 LAB — CBC WITH DIFFERENTIAL/PLATELET
Basophils Absolute: 0 10*3/uL (ref 0.0–0.2)
Basos: 1 %
EOS (ABSOLUTE): 0.1 10*3/uL (ref 0.0–0.4)
Eos: 1 %
Hematocrit: 45.1 % (ref 34.0–46.6)
Hemoglobin: 15.6 g/dL (ref 11.1–15.9)
Immature Grans (Abs): 0 10*3/uL (ref 0.0–0.1)
Immature Granulocytes: 0 %
Lymphocytes Absolute: 2.5 10*3/uL (ref 0.7–3.1)
Lymphs: 36 %
MCH: 32.4 pg (ref 26.6–33.0)
MCHC: 34.6 g/dL (ref 31.5–35.7)
MCV: 94 fL (ref 79–97)
Monocytes Absolute: 0.8 10*3/uL (ref 0.1–0.9)
Monocytes: 11 %
Neutrophils Absolute: 3.6 10*3/uL (ref 1.4–7.0)
Neutrophils: 51 %
Platelets: 127 10*3/uL — ABNORMAL LOW (ref 150–450)
RBC: 4.81 x10E6/uL (ref 3.77–5.28)
RDW: 12.9 % (ref 11.7–15.4)
WBC: 7 10*3/uL (ref 3.4–10.8)

## 2019-02-18 LAB — COMPREHENSIVE METABOLIC PANEL
ALT: 8 IU/L (ref 0–32)
AST: 21 IU/L (ref 0–40)
Albumin/Globulin Ratio: 1.6 (ref 1.2–2.2)
Albumin: 4.2 g/dL (ref 3.7–4.7)
Alkaline Phosphatase: 120 IU/L — ABNORMAL HIGH (ref 39–117)
BUN/Creatinine Ratio: 14 (ref 12–28)
BUN: 10 mg/dL (ref 8–27)
Bilirubin Total: 0.2 mg/dL (ref 0.0–1.2)
CO2: 22 mmol/L (ref 20–29)
Calcium: 9.7 mg/dL (ref 8.7–10.3)
Chloride: 99 mmol/L (ref 96–106)
Creatinine, Ser: 0.71 mg/dL (ref 0.57–1.00)
GFR calc Af Amer: 97 mL/min/{1.73_m2} (ref >59)
GFR calc non Af Amer: 84 mL/min/{1.73_m2} (ref >59)
Globulin, Total: 2.7 g/dL (ref 1.5–4.5)
Glucose: 91 mg/dL (ref 65–99)
Potassium: 4.5 mmol/L (ref 3.5–5.2)
Sodium: 139 mmol/L (ref 134–144)
Total Protein: 6.9 g/dL (ref 6.0–8.5)

## 2019-02-18 LAB — LAMOTRIGINE LEVEL: Lamotrigine Lvl: 5.8 ug/mL (ref 2.0–20.0)

## 2019-02-18 LAB — PHENYTOIN LEVEL, TOTAL: Phenytoin (Dilantin), Serum: 5.8 ug/mL — ABNORMAL LOW (ref 10.0–20.0)

## 2019-02-22 ENCOUNTER — Telehealth: Payer: Self-pay | Admitting: *Deleted

## 2019-02-22 DIAGNOSIS — Z5181 Encounter for therapeutic drug level monitoring: Secondary | ICD-10-CM

## 2019-02-22 DIAGNOSIS — G40001 Localization-related (focal) (partial) idiopathic epilepsy and epileptic syndromes with seizures of localized onset, not intractable, with status epilepticus: Secondary | ICD-10-CM

## 2019-02-22 NOTE — Telephone Encounter (Signed)
-----   Message from Ward Givens, NP sent at 02/22/2019  1:22 PM EST ----- Dilantin level is low please ensure that she is taking it appropriately and not missed any doses. Platelets are low.

## 2019-02-22 NOTE — Telephone Encounter (Signed)
Yes- we can recheck levels in 1-2 months

## 2019-02-23 NOTE — Telephone Encounter (Signed)
I called pt and relayed her lab results.   She states that she does take her dilantin daily.  She will get labs redone in 1-2 months.  She placed on her calendar.  I gave her the  days and hours of lab operation.

## 2019-03-08 ENCOUNTER — Other Ambulatory Visit: Payer: Self-pay

## 2019-03-08 ENCOUNTER — Ambulatory Visit (INDEPENDENT_AMBULATORY_CARE_PROVIDER_SITE_OTHER): Payer: Medicare Other | Admitting: Adult Health

## 2019-03-08 ENCOUNTER — Encounter: Payer: Self-pay | Admitting: Adult Health

## 2019-03-08 VITALS — BP 149/64 | HR 80 | Temp 97.4°F | Ht 64.0 in | Wt 121.4 lb

## 2019-03-08 DIAGNOSIS — G40001 Localization-related (focal) (partial) idiopathic epilepsy and epileptic syndromes with seizures of localized onset, not intractable, with status epilepticus: Secondary | ICD-10-CM

## 2019-03-08 DIAGNOSIS — Z5181 Encounter for therapeutic drug level monitoring: Secondary | ICD-10-CM

## 2019-03-08 MED ORDER — DILANTIN 100 MG PO CAPS: 300.0000 mg | ORAL_CAPSULE | Freq: Every day | ORAL | 3 refills | Status: DC

## 2019-03-08 NOTE — Progress Notes (Signed)
PATIENT: Sally Green DOB: 08/08/1945  REASON FOR VISIT: follow up HISTORY FROM: patient  HISTORY OF PRESENT ILLNESS: Today 03/08/19:  Sally Green is a 73 year old female with a history of seizures.  She returns today for follow-up.  She states that since her last visit on November 17 she feels that her seizure frequency has increased.  She had a seizure December 5, 6 and 2 on the seventh.  Her seizures have not changed.  She continues to have a tingling sensation down the center of the body followed by trouble speaking.  The events only last 30 seconds to 1 minute.  Her Dilantin level at the last visit was low.  She denies missing any medication.  She returns today for an evaluation.  HISTORY  02/15/19: Sally Green is a 73 year old female with a history of seizures.  She returns today for follow-up.  She states that her seizures have remained stable.  She has approximately 1 seizure a month.  On occasion she may have 2 events.  Her seizures have remained the same.  They start with a tingling down the center of her body and that she has trouble speaking.  These episodes typically are very brief.  Her last 2 seizures was October 9 and November 4.  She remains on Lamictal and Dilantin.  REVIEW OF SYSTEMS: Out of a complete 14 system review of symptoms, the patient complains only of the following symptoms, and all other reviewed systems are negative.  See HPI  ALLERGIES: Allergies  Allergen Reactions  . Depakote [Divalproex Sodium]     Nervous   . Aspirin     Instructed to never take ASA  . Keppra [Levetiracetam]     unknown  . Topamax [Topiramate]     unknown  . Guaifenesin Palpitations    HOME MEDICATIONS: Outpatient Medications Prior to Visit  Medication Sig Dispense Refill  . acetaminophen (TYLENOL) 500 MG tablet Take 500 mg by mouth daily as needed for moderate pain or headache.    . albuterol (PROVENTIL HFA;VENTOLIN HFA) 108 (90 Base) MCG/ACT inhaler Inhale 2 puffs into the  lungs every 6 (six) hours as needed for wheezing or shortness of breath.    Marland Kitchen alendronate (FOSAMAX) 70 MG tablet Take by mouth.    Marland Kitchen atorvastatin (LIPITOR) 40 MG tablet Take 40 mg by mouth every evening.   1  . DUREZOL 0.05 % EMUL Place 1 drop into the right eye daily.  0  . lamoTRIgine (LAMICTAL) 100 MG tablet Take 2.5 tabs in the AM, and 3 tabs in the afternoon. 495 tablet 3  . DILANTIN 100 MG ER capsule TAKE 2 CAPSULES(200 MG) BY MOUTH AT BEDTIME 180 capsule 3  . phenytoin (PHENYTOIN INFATABS) 50 MG tablet CHEW AND SWALLOW 1 TABLET BY MOUTH AT BEDTIME 90 tablet 3  . Calcium Carbonate-Vitamin D 600-400 MG-UNIT tablet Take 1 tablet by mouth 2 (two) times daily.     Marland Kitchen FLUoxetine (PROZAC) 10 MG capsule Take by mouth.     No facility-administered medications prior to visit.     PAST MEDICAL HISTORY: Past Medical History:  Diagnosis Date  . 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 AVM (arteriovenous malformation)   . Cancer ([place]) 2017   lung  . Cerebral hemorrhage ([place])   . Convulsions ([place])   . Depression   . Fracture, foot    Right   . History of colon polyps   . HOH (hard of hearing)    AIDS  . Hyperlipemia   .  Hypersomnia   . Lung mass   . Menopausal symptoms   . 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 Osteoporosis, postmenopausal   . 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  Sees dr Tommi Rumps at [facility]  Oct 2014.    . Seizures ([place])    LAST 9/18    PAST SURGICAL HISTORY: Past Surgical History:  Procedure Laterality Date  . ABDOMINAL HYSTERECTOMY    . BREAST BIOPSY Left 2004   neg  . CATARACT EXTRACTION W/PHACO Right 01/13/2017   Procedure: CATARACT EXTRACTION PHACO AND INTRAOCULAR LENS PLACEMENT (IOC);  Surgeon: Birder Robson, MD;  Location: [facility] ORS;  Service: Ophthalmology;  Laterality: Right;  [place]  00:49.3 AP%  14.7 CDE   7.28 Fluid pack lot  # [id number]  . CATARACT EXTRACTION W/PHACO Left 02/03/2017   Procedure: CATARACT EXTRACTION PHACO AND INTRAOCULAR LENS PLACEMENT (IOC);  Surgeon: Birder Robson, MD;  Location: [facility] ORS;  Service: Ophthalmology;  Laterality: Left;  [place] 00:41 AP% 14.3 CDE 6.01 Fluid pack lot # [id number]  . COLONOSCOPY WITH PROPOFOL N/A 04/10/2017   Procedure: COLONOSCOPY WITH PROPOFOL;  Surgeon: Manya Silvas, MD;  Location: [facility] END[facility]COPY;  Service: Endoscopy;  Laterality: N/A;  . dental implants    . ELECTROMAGNETIC NAVIGATION BROCHOSCOPY N/A 03/07/2015   Procedure: ELECTROMAGNETIC NAVIGATION BRONCHOSCOPY;  Surgeon: Flora Lipps, MD;  Location: [facility] ORS;  Service: Cardiopulmonary;  Laterality: N/A;  . EYE SURGERY    . oophrectomy    . SHOULDER SURGERY Left 2007  . VAGINAL DELIVERY     3 children    FAMILY HISTORY: Family History  Problem Relation Age of Onset  . Lymphoma Mother   . Alzheimer's disease Father   . Aneurysm Father   . Headache Father   . Cancer Other   . Aneurysm Paternal Uncle   . Headache Sister   . Cancer Sister        lung  . Cancer Maternal Aunt   . Breast cancer Maternal Aunt   . Cancer Paternal Aunt   . Leukemia Maternal Uncle     SOCIAL HISTORY: Social History   Socioeconomic History  . Marital status: Widowed    Spouse name: [name]  . Number of children: 3  . Years of education: 79  . Highest education level: Not on file  Occupational History  . Occupation: retired  Scientific laboratory technician  . Financial resource strain: Not on file  . Food insecurity    Worry: Not on file    Inability: Not on file  . Transportation needs    Medical: Not on file    Non-medical: Not on file  Tobacco Use  . Smoking status: Former Smoker    Packs/day: 1.25    Years: 50.00    Pack years: 62.50    Types: Cigarettes    Quit date: 06/24/2013    Years since quitting: 5.7  . Smokeless tobacco: Never Used  Substance and Sexual Activity  . Alcohol use: No  . Drug use: No  . Sexual  activity: Not on file  Lifestyle  . Physical activity    Days per week: Not on file    Minutes per session: Not on file  . Stress: Not on file  Relationships  . Social Herbalist on phone: Not on file    Gets together: Not on file    Attends religious service: Not on file  Active member of club or organization: Not on file    Attends meetings of clubs or organizations: Not on file    Relationship status: Not on file  . Intimate partner violence    Fear of current or ex partner: Not on file    Emotionally abused: Not on file    Physically abused: Not on file    Forced sexual activity: Not on file  Other Topics Concern  . Not on file  Social History Narrative   Patient is married ([name]) and lives at home with her husband.   Patient has three children.   Patient is retired.   Patient has a high school education.   Patient is right-handed.   Consumes caffeine rarely.      PHYSICAL EXAM  Vitals:   03/08/19 1012  BP: (!) 149/64  Pulse: 80  Temp: (!) 97.4 F (36.3 C)  TempSrc: Oral  Weight: 121 lb 6.4 oz (55.1 kg)  Height: 5\' 4"  (1.626 m)   Body mass index is 20.84 kg/m.  Generalized: Well developed, in no acute distress   Neurological examination  Mentation: Alert oriented to time, place, history taking. Follows all commands speech and language fluent Cranial nerve II-XII: Pupils were equal round reactive to light. Extraocular movements were full, visual field were full on confrontational test.  Head turning and shoulder shrug  were normal and symmetric. Motor: The motor testing reveals 5 over 5 strength of all 4 extremities. Good symmetric motor tone is noted throughout.  Sensory: Sensory testing is intact to soft touch on all 4 extremities. No evidence of extinction is noted.  Coordination: Cerebellar testing reveals good finger-nose-finger and heel-to-shin bilaterally.  Gait and station: Gait is normal.  Reflexes: Deep tendon reflexes are symmetric and  normal bilaterally.   DIAGNOSTIC DATA (LABS, IMAGING, TESTING) - I reviewed patient records, labs, notes, testing and imaging myself where available.  Lab Results  Component Value Date   WBC 7.0 02/15/2019   HGB 15.6 02/15/2019   HCT 45.1 02/15/2019   MCV 94 02/15/2019   PLT 127 (L) 02/15/2019      Component Value Date/Time   NA 139 02/15/2019 0950   NA 143 06/09/2013 1013   K 4.5 02/15/2019 0950   K 3.5 06/09/2013 1013   CL 99 02/15/2019 0950   CL 104 06/09/2013 1013   CO2 22 02/15/2019 0950   CO2 32 06/09/2013 1013   GLUCOSE 91 02/15/2019 0950   GLUCOSE 91 02/27/2015 1029   GLUCOSE 72 06/09/2013 1013   BUN 10 02/15/2019 0950   BUN 12 06/09/2013 1013   CREATININE 0.71 02/15/2019 0950   CREATININE 0.72 06/09/2013 1013   CALCIUM 9.7 02/15/2019 0950   CALCIUM 8.8 06/09/2013 1013   PROT 6.9 02/15/2019 0950   PROT 6.8 06/09/2013 1013   ALBUMIN 4.2 02/15/2019 0950   ALBUMIN 3.3 (L) 06/09/2013 1013   AST 21 02/15/2019 0950   AST 17 06/09/2013 1013   ALT 8 02/15/2019 0950   ALT 16 06/09/2013 1013   ALKPHOS 120 (H) 02/15/2019 0950   ALKPHOS 109 06/09/2013 1013   BILITOT 0.2 02/15/2019 0950   BILITOT 0.3 06/09/2013 1013   GFRNONAA 84 02/15/2019 0950   GFRNONAA >60 06/09/2013 1013   GFRAA 97 02/15/2019 0950   GFRAA >60 06/09/2013 1013      ASSESSMENT AND PLAN 73 y.o. 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 here with:  1.  Seizures  -Increase Dilantin to 300 mg at bedtime -Continue  Lamictal -Recheck Dilantin level & CBC in 1 month  Advised that if her seizure frequency continues to increase or she develops new symptoms she should let us know.  She will follow-up in 6 months or sooner if needed.    Ward Givens, MSN, NP-C 03/08/2019, 10:51 AM [facility] [address] [place],  [address] [phone]

## 2019-03-08 NOTE — Patient Instructions (Signed)
Your Plan:  Increase Dilantin to 300 mg at bedtime Continue Lamictal  Come in 1 month to check Dilantin level If your symptoms worsen or you develop new symptoms please let us know.       Thank you for coming to see [place] at [facility]. I hope we have been able to provide you high quality care today.  You may receive a patient satisfaction survey over the next few weeks. We would appreciate your feedback and comments so that we may continue to improve ourselves and the health of our patients.

## 2019-05-04 ENCOUNTER — Telehealth: Payer: Self-pay | Admitting: Adult Health

## 2019-05-04 NOTE — Telephone Encounter (Signed)
Please have patient come in to recheck lab work. Orders are in.

## 2019-05-05 NOTE — Telephone Encounter (Signed)
Called pt to inform her the provider would like for her to have more lab work done to check her dilatin level.   She does not need an appointment, NP Megan put in future orders for the labs.   Please relay  No answer left a VM.

## 2019-05-10 ENCOUNTER — Other Ambulatory Visit: Payer: Self-pay

## 2019-05-10 ENCOUNTER — Other Ambulatory Visit (INDEPENDENT_AMBULATORY_CARE_PROVIDER_SITE_OTHER): Payer: Self-pay

## 2019-05-10 DIAGNOSIS — Z0289 Encounter for other administrative examinations: Secondary | ICD-10-CM

## 2019-05-10 DIAGNOSIS — Z5181 Encounter for therapeutic drug level monitoring: Secondary | ICD-10-CM

## 2019-05-10 DIAGNOSIS — G40001 Localization-related (focal) (partial) idiopathic epilepsy and epileptic syndromes with seizures of localized onset, not intractable, with status epilepticus: Secondary | ICD-10-CM

## 2019-05-11 LAB — CBC WITH DIFFERENTIAL/PLATELET
Basophils Absolute: 0.1 10*3/uL (ref 0.0–0.2)
Basos: 1 %
EOS (ABSOLUTE): 0.1 10*3/uL (ref 0.0–0.4)
Eos: 1 %
Hematocrit: 43.5 % (ref 34.0–46.6)
Hemoglobin: 15.2 g/dL (ref 11.1–15.9)
Immature Grans (Abs): 0 10*3/uL (ref 0.0–0.1)
Immature Granulocytes: 0 %
Lymphocytes Absolute: 3.2 10*3/uL — ABNORMAL HIGH (ref 0.7–3.1)
Lymphs: 49 %
MCH: 32.4 pg (ref 26.6–33.0)
MCHC: 34.9 g/dL (ref 31.5–35.7)
MCV: 93 fL (ref 79–97)
Monocytes Absolute: 0.8 10*3/uL (ref 0.1–0.9)
Monocytes: 12 %
Neutrophils Absolute: 2.4 10*3/uL (ref 1.4–7.0)
Neutrophils: 37 %
Platelets: 224 10*3/uL (ref 150–450)
RBC: 4.69 x10E6/uL (ref 3.77–5.28)
RDW: 13.1 % (ref 11.7–15.4)
WBC: 6.6 10*3/uL (ref 3.4–10.8)

## 2019-05-11 LAB — PHENYTOIN LEVEL, TOTAL: Phenytoin (Dilantin), Serum: 13.2 ug/mL (ref 10.0–20.0)

## 2019-08-30 ENCOUNTER — Other Ambulatory Visit: Payer: Self-pay | Admitting: Adult Health

## 2019-08-30 DIAGNOSIS — G40001 Localization-related (focal) (partial) idiopathic epilepsy and epileptic syndromes with seizures of localized onset, not intractable, with status epilepticus: Secondary | ICD-10-CM

## 2019-10-04 ENCOUNTER — Ambulatory Visit (INDEPENDENT_AMBULATORY_CARE_PROVIDER_SITE_OTHER): Payer: Medicare Other | Admitting: Adult Health

## 2019-10-04 ENCOUNTER — Encounter: Payer: Self-pay | Admitting: Adult Health

## 2019-10-04 VITALS — BP 140/58 | HR 77 | Ht 64.0 in | Wt 118.0 lb

## 2019-10-04 DIAGNOSIS — G40001 Localization-related (focal) (partial) idiopathic epilepsy and epileptic syndromes with seizures of localized onset, not intractable, with status epilepticus: Secondary | ICD-10-CM

## 2019-10-04 DIAGNOSIS — Z5181 Encounter for therapeutic drug level monitoring: Secondary | ICD-10-CM | POA: Diagnosis not present

## 2019-10-04 NOTE — Patient Instructions (Signed)
Your Plan:  Continue Dilantin and Lamictal  Blood work today If your symptoms worsen or you develop new symptoms please let us know.    Thank you for coming to see [place] at [facility]. I hope we have been able to provide you high quality care today.  You may receive a patient satisfaction survey over the next few weeks. We would appreciate your feedback and comments so that we may continue to improve ourselves and the health of our patients.

## 2019-10-04 NOTE — Progress Notes (Signed)
PATIENT: Sally Green DOB: [birth year]  REASON FOR VISIT: follow up HISTORY FROM: patient  HISTORY OF PRESENT ILLNESS: Today 10/04/19:  Sally Green is a 74 year old female with a history of seizures.  She returns today for follow-up.  She reports that since we increased Dilantin she is only had 2 events.  Reports that her seizures have remained the same.  She reports a tingling sensation down the center of the body and trouble speaking.  Reports that these events only last approximately 1 minute.  She denies any new symptoms.  She returns today for an evaluation.  HISTORY 03/08/19:  Sally Green is a 74 year old female with a history of seizures.  She returns today for follow-up.  She states that since her last visit on November 17 she feels that her seizure frequency has increased.  She had a seizure December 5, 6 and 2 on the seventh.  Her seizures have not changed.  She continues to have a tingling sensation down the center of the body followed by trouble speaking.  The events only last 30 seconds to 1 minute.  Her Dilantin level at the last visit was low.  She denies missing any medication.  She returns today for an evaluation.  REVIEW OF SYSTEMS: Out of a complete 14 system review of symptoms, the patient complains only of the following symptoms, and all other reviewed systems are negative.  See HPI  ALLERGIES: Allergies  Allergen Reactions  . Depakote [Divalproex Sodium]     Nervous   . Aspirin     Instructed to never take ASA  . Keppra [Levetiracetam]     unknown  . Topamax [Topiramate]     unknown  . Guaifenesin Palpitations    HOME MEDICATIONS: Outpatient Medications Prior to Visit  Medication Sig Dispense Refill  . acetaminophen (TYLENOL) 500 MG tablet Take 500 mg by mouth daily as needed for moderate pain or headache.    . albuterol (PROVENTIL HFA;VENTOLIN HFA) 108 (90 Base) MCG/ACT inhaler Inhale 2 puffs into the lungs every 6 (six) hours as needed for wheezing or  shortness of breath.    Marland Kitchen alendronate (FOSAMAX) 70 MG tablet Take by mouth.    Marland Kitchen atorvastatin (LIPITOR) 40 MG tablet Take 40 mg by mouth every evening.   1  . Calcium Carbonate-Vitamin D 600-400 MG-UNIT tablet Take 1 tablet by mouth 2 (two) times daily.     Marland Kitchen DILANTIN 100 MG ER capsule Take 3 capsules (300 mg total) by mouth at bedtime. 270 capsule 3  . DUREZOL 0.05 % EMUL Place 1 drop into the right eye daily.  0  . FLUoxetine (PROZAC) 10 MG capsule Take by mouth.    . lamoTRIgine (LAMICTAL) 100 MG tablet TAKE 2.5 TABLETS IN THE AM, TAKE 3 TABLETS IN THE AFTERNOON 495 tablet 0   No facility-administered medications prior to visit.    PAST MEDICAL HISTORY: Past Medical History:  Diagnosis Date  . 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 AVM (arteriovenous malformation)   . Cancer ([place]) 2017   lung  . Cerebral hemorrhage ([place])   . Convulsions ([place])   . Depression   . Fracture, foot    Right   . History of colon polyps   . HOH (hard of hearing)    AIDS  . Hyperlipemia   . Hypersomnia   . Lung mass   . Menopausal symptoms   . 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 Osteoporosis, postmenopausal   . Other  forms of epilepsy and recurrent seizures without mention of intractable epilepsy 11/09/2012    Arterio-venus malformation related seizures.  Sees dr Tommi Rumps at [facility]  Oct 2014.    . Seizures ([place])    LAST 9/18    PAST SURGICAL HISTORY: Past Surgical History:  Procedure Laterality Date  . ABDOMINAL HYSTERECTOMY    . BREAST BIOPSY Left 2004   neg  . CATARACT EXTRACTION W/PHACO Right 01/13/2017   Procedure: CATARACT EXTRACTION PHACO AND INTRAOCULAR LENS PLACEMENT (IOC);  Surgeon: Birder Robson, MD;  Location: [facility] ORS;  Service: Ophthalmology;  Laterality: Right;  [place]  00:49.3 AP%  14.7 CDE   7.28 Fluid pack lot # [id number]  . CATARACT EXTRACTION W/PHACO Left 02/03/2017   Procedure: CATARACT EXTRACTION PHACO AND INTRAOCULAR LENS PLACEMENT (IOC);   Surgeon: Birder Robson, MD;  Location: [facility] ORS;  Service: Ophthalmology;  Laterality: Left;  [place] 00:41 AP% 14.3 CDE 6.01 Fluid pack lot # [id number]  . COLONOSCOPY WITH PROPOFOL N/A 04/10/2017   Procedure: COLONOSCOPY WITH PROPOFOL;  Surgeon: Manya Silvas, MD;  Location[facility] END[facility]COPY;  Service: Endoscopy;  Laterality: N/A;  . dental implants    . ELECTROMAGNETIC NAVIGATION BROCHOSCOPY N/A 03/07/2015   Procedure: ELECTROMAGNETIC NAVIGATION BRONCHOSCOPY;  Surgeon: Flora Lipps, MD;  Location: [facility] ORS;  Service: Cardiopulmonary;  Laterality: N/A;  . EYE SURGERY    . oophrectomy    . SHOULDER SURGERY Left 2007  . VAGINAL DELIVERY     3 children    FAMILY HISTORY: Family History  Problem Relation Age of Onset  . Lymphoma Mother   . Alzheimer's disease Father   . Aneurysm Father   . Headache Father   . Cancer Other   . Aneurysm Paternal Uncle   . Headache Sister   . Cancer Sister        lung  . Cancer Maternal Aunt   . Breast cancer Maternal Aunt   . Cancer Paternal Aunt   . Leukemia Maternal Uncle     SOCIAL HISTORY: Social History   Socioeconomic History  . Marital status: Widowed    Spouse name: [name]  . Number of children: 3  . Years of education: 35  . Highest education level: Not on file  Occupational History  . Occupation: retired  Tobacco Use  . Smoking status: Former Smoker    Packs/day: 1.25    Years: 50.00    Pack years: 62.50    Types: Cigarettes    Quit date: 06/24/2013    Years since quitting: 6.2  . Smokeless tobacco: Never Used  Vaping Use  . Vaping Use: Never used  Substance and Sexual Activity  . Alcohol use: No  . Drug use: No  . Sexual activity: Not on file  Other Topics Concern  . Not on file  Social History Narrative   Patient is married ([name]) and lives at home with her husband.   Patient has three children.   Patient is retired.   Patient has a high school education.   Patient is right-handed.   Consumes caffeine rarely.    Social Determinants of Health   Financial Resource Strain:   . Difficulty of Paying Living Expenses:   Food Insecurity:   . Worried About Charity fundraiser in the Last Year:   . Arboriculturist in the Last Year:   Transportation Needs:   . Film/video editor (Medical):   Marland Kitchen Lack of Transportation (Non-Medical):   Physical Activity:   . Days of Exercise per  Week:   . Minutes of Exercise per Session:   Stress:   . Feeling of Stress :   Social Connections:   . Frequency of Communication with Friends and Family:   . Frequency of Social Gatherings with Friends and Family:   . Attends Religious Services:   . Active Member of Clubs or Organizations:   . Attends Archivist Meetings:   Marland Kitchen Marital Status:   Intimate Partner Violence:   . Fear of Current or Ex-Partner:   . Emotionally Abused:   Marland Kitchen Physically Abused:   . Sexually Abused:       PHYSICAL EXAM  Vitals:   10/04/19 1019  BP: (!) 140/58  Pulse: 77  Weight: 118 lb (53.5 kg)  Height: 5\' 4"  (1.626 m)   Body mass index is 20.25 kg/m.  Generalized: Well developed, in no acute distress   Neurological examination  Mentation: Alert oriented to time, place, history taking. Follows all commands speech and language fluent Cranial nerve II-XII: Pupils were equal round reactive to light. Extraocular movements were full, visual field were full on confrontational test.  Head turning and shoulder shrug  were normal and symmetric. Motor: The motor testing reveals 5 over 5 strength of all 4 extremities. Good symmetric motor tone is noted throughout.  Sensory: Sensory testing is intact to soft touch on all 4 extremities. No evidence of extinction is noted.  Coordination: Cerebellar testing reveals good finger-nose-finger and heel-to-shin bilaterally.  Gait and station: Gait is normal.   Reflexes: Deep tendon reflexes are symmetric and normal bilaterally.   DIAGNOSTIC DATA (LABS, IMAGING, TESTING) - I reviewed  patient records, labs, notes, testing and imaging myself where available.  Lab Results  Component Value Date   WBC 6.6 05/10/2019   HGB 15.2 05/10/2019   HCT 43.5 05/10/2019   MCV 93 05/10/2019   PLT 224 05/10/2019      Component Value Date/Time   NA 139 02/15/2019 0950   NA 143 06/09/2013 1013   K 4.5 02/15/2019 0950   K 3.5 06/09/2013 1013   CL 99 02/15/2019 0950   CL 104 06/09/2013 1013   CO2 22 02/15/2019 0950   CO2 32 06/09/2013 1013   GLUCOSE 91 02/15/2019 0950   GLUCOSE 91 02/27/2015 1029   GLUCOSE 72 06/09/2013 1013   BUN 10 02/15/2019 0950   BUN 12 06/09/2013 1013   CREATININE 0.71 02/15/2019 0950   CREATININE 0.72 06/09/2013 1013   CALCIUM 9.7 02/15/2019 0950   CALCIUM 8.8 06/09/2013 1013   PROT 6.9 02/15/2019 0950   PROT 6.8 06/09/2013 1013   ALBUMIN 4.2 02/15/2019 0950   ALBUMIN 3.3 (L) 06/09/2013 1013   AST 21 02/15/2019 0950   AST 17 06/09/2013 1013   ALT 8 02/15/2019 0950   ALT 16 06/09/2013 1013   ALKPHOS 120 (H) 02/15/2019 0950   ALKPHOS 109 06/09/2013 1013   BILITOT 0.2 02/15/2019 0950   BILITOT 0.3 06/09/2013 1013   GFRNONAA 84 02/15/2019 0950   GFRNONAA >60 06/09/2013 1013   GFRAA 97 02/15/2019 0950   GFRAA >60 06/09/2013 1013      ASSESSMENT AND PLAN 74 y.o. 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 here with  Seizures   Continue Dilantin and Lamictal  Blood work today  Advised if she has any seizure event she should let us know  Follow-up in 1 year or  sooner if needed   I spent 20 minutes of face-to-face and non-face-to-face time with patient.  This included previsit chart review, lab review, study review, order entry, electronic health record documentation, patient education.  Ward Givens, MSN, NP-C 10/04/2019, 10:36 AM [facility] [address] [place], [place] [address] [phone]

## 2019-10-05 ENCOUNTER — Telehealth: Payer: Self-pay | Admitting: *Deleted

## 2019-10-05 LAB — COMPREHENSIVE METABOLIC PANEL
ALT: 10 IU/L (ref 0–32)
AST: 15 IU/L (ref 0–40)
Albumin/Globulin Ratio: 1.6 (ref 1.2–2.2)
Albumin: 4 g/dL (ref 3.7–4.7)
Alkaline Phosphatase: 113 IU/L (ref 48–121)
BUN/Creatinine Ratio: 21 (ref 12–28)
BUN: 14 mg/dL (ref 8–27)
Bilirubin Total: <0.2 mg/dL (ref 0.0–1.2)
CO2: 31 mmol/L — ABNORMAL HIGH (ref 20–29)
Calcium: 9.9 mg/dL (ref 8.7–10.3)
Chloride: 103 mmol/L (ref 96–106)
Creatinine, Ser: 0.68 mg/dL (ref 0.57–1.00)
GFR calc Af Amer: 99 mL/min/{1.73_m2} (ref >59)
GFR calc non Af Amer: 86 mL/min/{1.73_m2} (ref >59)
Globulin, Total: 2.5 g/dL (ref 1.5–4.5)
Glucose: 81 mg/dL (ref 65–99)
Potassium: 4.3 mmol/L (ref 3.5–5.2)
Sodium: 146 mmol/L — ABNORMAL HIGH (ref 134–144)
Total Protein: 6.5 g/dL (ref 6.0–8.5)

## 2019-10-05 LAB — CBC WITH DIFFERENTIAL/PLATELET
Basophils Absolute: 0 10*3/uL (ref 0.0–0.2)
Basos: 1 %
EOS (ABSOLUTE): 0.1 10*3/uL (ref 0.0–0.4)
Eos: 1 %
Hematocrit: 41.8 % (ref 34.0–46.6)
Hemoglobin: 14.5 g/dL (ref 11.1–15.9)
Immature Grans (Abs): 0 10*3/uL (ref 0.0–0.1)
Immature Granulocytes: 0 %
Lymphocytes Absolute: 2.4 10*3/uL (ref 0.7–3.1)
Lymphs: 44 %
MCH: 31.8 pg (ref 26.6–33.0)
MCHC: 34.7 g/dL (ref 31.5–35.7)
MCV: 92 fL (ref 79–97)
Monocytes Absolute: 0.7 10*3/uL (ref 0.1–0.9)
Monocytes: 12 %
Neutrophils Absolute: 2.3 10*3/uL (ref 1.4–7.0)
Neutrophils: 42 %
Platelets: 230 10*3/uL (ref 150–450)
RBC: 4.56 x10E6/uL (ref 3.77–5.28)
RDW: 13.4 % (ref 11.7–15.4)
WBC: 5.4 10*3/uL (ref 3.4–10.8)

## 2019-10-05 LAB — LAMOTRIGINE LEVEL: Lamotrigine Lvl: 3.2 ug/mL (ref 2.0–20.0)

## 2019-10-05 LAB — PHENYTOIN LEVEL, TOTAL: Phenytoin (Dilantin), Serum: 10.4 ug/mL (ref 10.0–20.0)

## 2019-10-05 NOTE — Telephone Encounter (Signed)
-----   Message from Ward Givens, NP sent at 10/05/2019 12:31 PM EDT ----- Labs results are unremarkable. Please call patient with results.

## 2019-10-05 NOTE — Telephone Encounter (Signed)
I called pt and relayed the results of labs to her, as unremarkable ( phenytoin level is normal.  Lamotrigine is pending).

## 2019-10-07 ENCOUNTER — Ambulatory Visit
Admission: RE | Admit: 2019-10-07 | Discharge: 2019-10-07 | Disposition: A | Discharge status: Home / Self Care | Payer: Medicare Other | Source: Ambulatory Visit | Attending: Radiation Oncology | Admitting: Radiation Oncology

## 2019-10-07 ENCOUNTER — Other Ambulatory Visit: Payer: Self-pay

## 2019-10-07 DIAGNOSIS — C3411 Malignant neoplasm of upper lobe, right bronchus or lung: Secondary | ICD-10-CM | POA: Diagnosis not present

## 2019-10-07 MED ORDER — IOHEXOL 300 MG/ML  SOLN
60.0000 mL | Freq: Once | INTRAMUSCULAR | Status: AC | PRN
  Administered 2019-10-07: 60 mL via INTRAVENOUS

## 2019-10-14 ENCOUNTER — Ambulatory Visit
Admission: RE | Admit: 2019-10-14 | Discharge: 2019-10-14 | Disposition: A | Discharge status: Home / Self Care | Payer: Medicare Other | Source: Ambulatory Visit | Attending: Radiation Oncology | Admitting: Radiation Oncology

## 2019-10-14 ENCOUNTER — Telehealth: Payer: Self-pay | Admitting: *Deleted

## 2019-10-14 ENCOUNTER — Other Ambulatory Visit: Payer: Self-pay

## 2019-10-14 ENCOUNTER — Other Ambulatory Visit: Payer: Self-pay | Admitting: *Deleted

## 2019-10-14 VITALS — BP 148/58 | HR 79 | Temp 97.8°F | Resp 20 | Wt 116.5 lb

## 2019-10-14 DIAGNOSIS — Z923 Personal history of irradiation: Secondary | ICD-10-CM | POA: Diagnosis not present

## 2019-10-14 DIAGNOSIS — Z85118 Personal history of other malignant neoplasm of bronchus and lung: Secondary | ICD-10-CM | POA: Diagnosis present

## 2019-10-14 DIAGNOSIS — C3411 Malignant neoplasm of upper lobe, right bronchus or lung: Secondary | ICD-10-CM

## 2019-10-14 NOTE — Progress Notes (Signed)
Radiation Oncology Follow up Note  Name: Sally Green   Date:   10/14/2019 MRN:  [id number] DOB: 29-Mar-1946    This 74 y.o. 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  REFERRING PROVIDER: Leonel Ramsay, MD  HPI: Patient is a 74 year old female now out over 4-1/2 years having completed radiation therapy with SBRT to her right upper lobe for stage I squamous cell carcinoma seen today in routine follow-up she is doing well.  She specifically denies cough hemoptysis any dysphagia.  She had a recent CT scan.  Which I have reviewed showing stable appearance of treated tumor in the right upper lobe with surrounding fibrotic changes.  COMPLICATIONS OF TREATMENT: none  FOLLOW UP COMPLIANCE: keeps appointments   PHYSICAL EXAM:  BP (!) 148/58   Pulse 79   Temp 97.8 F (36.6 C)   Resp 20   Wt 116 lb 8 oz (52.8 kg)   SpO2 98%   BMI 20.00 kg/m  Thin appearing female in NAD.  Well-developed well-nourished patient in NAD. HEENT reveals PERLA, EOMI, discs not visualized.  Oral cavity is clear. No oral mucosal lesions are identified. Neck is clear without evidence of cervical or supraclavicular adenopathy. Lungs are clear to A&P. Cardiac examination is essentially unremarkable with regular rate and rhythm without murmur rub or thrill. Abdomen is benign with no organomegaly or masses noted. Motor sensory and DTR levels are equal and symmetric in the upper and lower extremities. Cranial nerves II through XII are grossly intact. Proprioception is intact. No peripheral adenopathy or edema is identified. No motor or sensory levels are noted. Crude visual fields are within normal range.  RADIOLOGY RESULTS: CT scans reviewed compatible with above-stated findings  PLAN: Present time patient continues to do well now close to 5 years out with no evidence of disease.  We will continue to monitor chest with CT scan  in another year.  With a follow-up.  Patient is to call with any concerns at any time.  I would like to take this opportunity to thank you for allowing me to participate in the care of your patient.Noreene Filbert, MD

## 2019-10-14 NOTE — Progress Notes (Signed)
Chart opened to add CT order.

## 2019-10-14 NOTE — Telephone Encounter (Signed)
Called patient to notify her of upcoming appts: CT of chest on 05/17/20 at [facility] appointment at 10:00 arrive at 9:30 AM Clear liquids 4 hours prior to appointment. 10/19/20 Follow up with results at 10 AM Dr. Baruch Gouty.

## 2019-10-20 ENCOUNTER — Other Ambulatory Visit: Payer: Self-pay | Admitting: Infectious Diseases

## 2019-10-20 ENCOUNTER — Other Ambulatory Visit: Payer: Self-pay | Admitting: Internal Medicine

## 2019-10-20 DIAGNOSIS — Z1231 Encounter for screening mammogram for malignant neoplasm of breast: Secondary | ICD-10-CM

## 2019-11-17 ENCOUNTER — Ambulatory Visit
Admission: RE | Admit: 2019-11-17 | Discharge: 2019-11-17 | Disposition: A | Discharge status: Home / Self Care | Payer: Medicare Other | Source: Ambulatory Visit | Attending: Infectious Diseases | Admitting: Infectious Diseases

## 2019-11-17 ENCOUNTER — Other Ambulatory Visit: Payer: Self-pay

## 2019-11-17 DIAGNOSIS — Z1231 Encounter for screening mammogram for malignant neoplasm of breast: Secondary | ICD-10-CM | POA: Diagnosis not present

## 2019-11-29 ENCOUNTER — Other Ambulatory Visit: Payer: Self-pay | Admitting: Infectious Diseases

## 2019-11-29 DIAGNOSIS — R928 Other abnormal and inconclusive findings on diagnostic imaging of breast: Secondary | ICD-10-CM

## 2019-11-29 DIAGNOSIS — N6489 Other specified disorders of breast: Secondary | ICD-10-CM

## 2019-11-30 ENCOUNTER — Other Ambulatory Visit: Payer: Self-pay | Admitting: Adult Health

## 2019-11-30 DIAGNOSIS — G40001 Localization-related (focal) (partial) idiopathic epilepsy and epileptic syndromes with seizures of localized onset, not intractable, with status epilepticus: Secondary | ICD-10-CM

## 2019-12-03 ENCOUNTER — Other Ambulatory Visit: Payer: Self-pay | Admitting: Adult Health

## 2019-12-03 DIAGNOSIS — G40001 Localization-related (focal) (partial) idiopathic epilepsy and epileptic syndromes with seizures of localized onset, not intractable, with status epilepticus: Secondary | ICD-10-CM

## 2019-12-13 ENCOUNTER — Other Ambulatory Visit: Payer: Self-pay

## 2019-12-13 ENCOUNTER — Ambulatory Visit
Admission: RE | Admit: 2019-12-13 | Discharge: 2019-12-13 | Disposition: A | Discharge status: Home / Self Care | Payer: Medicare Other | Source: Ambulatory Visit | Attending: Infectious Diseases | Admitting: Infectious Diseases

## 2019-12-13 DIAGNOSIS — N6489 Other specified disorders of breast: Secondary | ICD-10-CM | POA: Insufficient documentation

## 2019-12-13 DIAGNOSIS — R928 Other abnormal and inconclusive findings on diagnostic imaging of breast: Secondary | ICD-10-CM | POA: Insufficient documentation

## 2020-02-15 ENCOUNTER — Ambulatory Visit: Payer: Medicare Other | Admitting: Adult Health

## 2020-02-18 ENCOUNTER — Inpatient Hospital Stay
Admission: EM | Admit: 2020-02-18 | Discharge: 2020-02-21 | DRG: 193 | Disposition: A | Discharge status: Skilled Nursing Facility | Payer: Medicare Other | Source: Other Acute Inpatient Hospital | Attending: Internal Medicine | Admitting: Internal Medicine

## 2020-02-18 ENCOUNTER — Other Ambulatory Visit: Payer: Self-pay

## 2020-02-18 ENCOUNTER — Emergency Department: Payer: Medicare Other

## 2020-02-18 ENCOUNTER — Encounter: Payer: Self-pay | Admitting: Emergency Medicine

## 2020-02-18 DIAGNOSIS — G9589 Other specified diseases of spinal cord: Secondary | ICD-10-CM | POA: Diagnosis present

## 2020-02-18 DIAGNOSIS — Z20822 Contact with and (suspected) exposure to covid-19: Secondary | ICD-10-CM | POA: Diagnosis present

## 2020-02-18 DIAGNOSIS — A419 Sepsis, unspecified organism: Secondary | ICD-10-CM

## 2020-02-18 DIAGNOSIS — Z79899 Other long term (current) drug therapy: Secondary | ICD-10-CM | POA: Diagnosis not present

## 2020-02-18 DIAGNOSIS — F17200 Nicotine dependence, unspecified, uncomplicated: Secondary | ICD-10-CM | POA: Diagnosis present

## 2020-02-18 DIAGNOSIS — J9601 Acute respiratory failure with hypoxia: Secondary | ICD-10-CM | POA: Diagnosis not present

## 2020-02-18 DIAGNOSIS — R918 Other nonspecific abnormal finding of lung field: Secondary | ICD-10-CM | POA: Diagnosis present

## 2020-02-18 DIAGNOSIS — K7460 Unspecified cirrhosis of liver: Secondary | ICD-10-CM | POA: Diagnosis present

## 2020-02-18 DIAGNOSIS — Z807 Family history of other malignant neoplasms of lymphoid, hematopoietic and related tissues: Secondary | ICD-10-CM | POA: Diagnosis not present

## 2020-02-18 DIAGNOSIS — Z923 Personal history of irradiation: Secondary | ICD-10-CM | POA: Diagnosis not present

## 2020-02-18 DIAGNOSIS — J449 Chronic obstructive pulmonary disease, unspecified: Secondary | ICD-10-CM | POA: Diagnosis not present

## 2020-02-18 DIAGNOSIS — R0602 Shortness of breath: Secondary | ICD-10-CM

## 2020-02-18 DIAGNOSIS — H9190 Unspecified hearing loss, unspecified ear: Secondary | ICD-10-CM | POA: Diagnosis present

## 2020-02-18 DIAGNOSIS — J189 Pneumonia, unspecified organism: Secondary | ICD-10-CM | POA: Diagnosis not present

## 2020-02-18 DIAGNOSIS — R0902 Hypoxemia: Secondary | ICD-10-CM | POA: Diagnosis not present

## 2020-02-18 DIAGNOSIS — Z9842 Cataract extraction status, left eye: Secondary | ICD-10-CM

## 2020-02-18 DIAGNOSIS — Z9841 Cataract extraction status, right eye: Secondary | ICD-10-CM

## 2020-02-18 DIAGNOSIS — Z806 Family history of leukemia: Secondary | ICD-10-CM | POA: Diagnosis not present

## 2020-02-18 DIAGNOSIS — Z961 Presence of intraocular lens: Secondary | ICD-10-CM | POA: Diagnosis present

## 2020-02-18 DIAGNOSIS — J9621 Acute and chronic respiratory failure with hypoxia: Secondary | ICD-10-CM | POA: Diagnosis present

## 2020-02-18 DIAGNOSIS — G40909 Epilepsy, unspecified, not intractable, without status epilepticus: Secondary | ICD-10-CM | POA: Diagnosis present

## 2020-02-18 DIAGNOSIS — Z7983 Long term (current) use of bisphosphonates: Secondary | ICD-10-CM | POA: Diagnosis not present

## 2020-02-18 DIAGNOSIS — Z803 Family history of malignant neoplasm of breast: Secondary | ICD-10-CM | POA: Diagnosis not present

## 2020-02-18 DIAGNOSIS — R6520 Severe sepsis without septic shock: Secondary | ICD-10-CM

## 2020-02-18 DIAGNOSIS — Z87891 Personal history of nicotine dependence: Secondary | ICD-10-CM | POA: Diagnosis not present

## 2020-02-18 DIAGNOSIS — E785 Hyperlipidemia, unspecified: Secondary | ICD-10-CM | POA: Diagnosis present

## 2020-02-18 DIAGNOSIS — M810 Age-related osteoporosis without current pathological fracture: Secondary | ICD-10-CM | POA: Diagnosis present

## 2020-02-18 DIAGNOSIS — J439 Emphysema, unspecified: Secondary | ICD-10-CM | POA: Diagnosis present

## 2020-02-18 DIAGNOSIS — Z85118 Personal history of other malignant neoplasm of bronchus and lung: Secondary | ICD-10-CM | POA: Diagnosis not present

## 2020-02-18 DIAGNOSIS — E876 Hypokalemia: Secondary | ICD-10-CM | POA: Diagnosis not present

## 2020-02-18 DIAGNOSIS — I10 Essential (primary) hypertension: Secondary | ICD-10-CM | POA: Diagnosis present

## 2020-02-18 DIAGNOSIS — Z820 Family history of epilepsy and other diseases of the nervous system: Secondary | ICD-10-CM | POA: Diagnosis not present

## 2020-02-18 DIAGNOSIS — Z90710 Acquired absence of both cervix and uterus: Secondary | ICD-10-CM

## 2020-02-18 DIAGNOSIS — N390 Urinary tract infection, site not specified: Secondary | ICD-10-CM | POA: Diagnosis present

## 2020-02-18 DIAGNOSIS — Z86010 Personal history of colonic polyps: Secondary | ICD-10-CM | POA: Diagnosis not present

## 2020-02-18 HISTORY — DX: Pneumonia, unspecified organism: J18.9

## 2020-02-18 LAB — PROCALCITONIN: Procalcitonin: 0.32 ng/mL

## 2020-02-18 LAB — CBC WITH DIFFERENTIAL/PLATELET
Abs Immature Granulocytes: 0.09 10*3/uL — ABNORMAL HIGH (ref 0.00–0.07)
Basophils Absolute: 0.1 10*3/uL (ref 0.0–0.1)
Basophils Relative: 0 %
Eosinophils Absolute: 0 10*3/uL (ref 0.0–0.5)
Eosinophils Relative: 0 %
HCT: 44.7 % (ref 36.0–46.0)
Hemoglobin: 14.6 g/dL (ref 12.0–15.0)
Immature Granulocytes: 1 %
Lymphocytes Relative: 7 %
Lymphs Abs: 1 10*3/uL (ref 0.7–4.0)
MCH: 31.5 pg (ref 26.0–34.0)
MCHC: 32.7 g/dL (ref 30.0–36.0)
MCV: 96.3 fL (ref 80.0–100.0)
Monocytes Absolute: 2 10*3/uL — ABNORMAL HIGH (ref 0.1–1.0)
Monocytes Relative: 13 %
Neutro Abs: 12.5 10*3/uL — ABNORMAL HIGH (ref 1.7–7.7)
Neutrophils Relative %: 79 %
Platelets: 253 10*3/uL (ref 150–400)
RBC: 4.64 MIL/uL (ref 3.87–5.11)
RDW: 14.1 % (ref 11.5–15.5)
WBC: 15.6 10*3/uL — ABNORMAL HIGH (ref 4.0–10.5)
nRBC: 0 % (ref 0.0–0.2)

## 2020-02-18 LAB — COMPREHENSIVE METABOLIC PANEL
ALT: 16 U/L (ref 0–44)
AST: 21 U/L (ref 15–41)
Albumin: 3.2 g/dL — ABNORMAL LOW (ref 3.5–5.0)
Alkaline Phosphatase: 127 U/L — ABNORMAL HIGH (ref 38–126)
Anion gap: 11 (ref 5–15)
BUN: 22 mg/dL (ref 8–23)
CO2: 34 mmol/L — ABNORMAL HIGH (ref 22–32)
Calcium: 10.2 mg/dL (ref 8.9–10.3)
Chloride: 95 mmol/L — ABNORMAL LOW (ref 98–111)
Creatinine, Ser: 0.51 mg/dL (ref 0.44–1.00)
GFR, Estimated: >60 mL/min (ref >60)
Glucose, Bld: 118 mg/dL — ABNORMAL HIGH (ref 70–99)
Potassium: 3.4 mmol/L — ABNORMAL LOW (ref 3.5–5.1)
Sodium: 140 mmol/L (ref 135–145)
Total Bilirubin: 0.7 mg/dL (ref 0.3–1.2)
Total Protein: 7.4 g/dL (ref 6.5–8.1)

## 2020-02-18 LAB — RESP PANEL BY RT-PCR (FLU A&B, COVID) ARPGX2
Influenza A by PCR: NEGATIVE
Influenza B by PCR: NEGATIVE
SARS Coronavirus 2 by RT PCR: NEGATIVE

## 2020-02-18 LAB — LACTIC ACID, PLASMA: Lactic Acid, Venous: 0.8 mmol/L (ref 0.5–1.9)

## 2020-02-18 MED ORDER — SODIUM CHLORIDE 0.9 % IV SOLN
2.0000 g | INTRAVENOUS | Status: DC
  Administered 2020-02-18 – 2020-02-20 (×3): 2 g via INTRAVENOUS
  Filled 2020-02-18: qty 20
  Filled 2020-02-18: qty 2
  Filled 2020-02-18: qty 20
  Filled 2020-02-18: qty 2
  Filled 2020-02-18 (×2): qty 20

## 2020-02-18 MED ORDER — SODIUM CHLORIDE 0.9 % IV SOLN
500.0000 mg | INTRAVENOUS | Status: DC
  Administered 2020-02-18 – 2020-02-21 (×4): 500 mg via INTRAVENOUS
  Filled 2020-02-18 (×4): qty 500

## 2020-02-18 MED ORDER — LACTATED RINGERS IV SOLN: INTRAVENOUS | Status: AC

## 2020-02-18 MED ORDER — ENOXAPARIN SODIUM 40 MG/0.4ML ~~LOC~~ SOLN
40.0000 mg | SUBCUTANEOUS | Status: DC
  Administered 2020-02-18 – 2020-02-20 (×3): 40 mg via SUBCUTANEOUS
  Filled 2020-02-18 (×3): qty 0.4

## 2020-02-18 MED ORDER — LACTATED RINGERS IV BOLUS (SEPSIS)
250.0000 mL | Freq: Once | INTRAVENOUS | Status: AC
  Administered 2020-02-18: 250 mL via INTRAVENOUS

## 2020-02-18 MED ORDER — LACTATED RINGERS IV BOLUS (SEPSIS)
500.0000 mL | Freq: Once | INTRAVENOUS | Status: AC
  Administered 2020-02-18: 500 mL via INTRAVENOUS

## 2020-02-18 MED ORDER — SODIUM CHLORIDE 0.9 % IV SOLN: INTRAVENOUS | Status: DC

## 2020-02-18 MED ORDER — SODIUM CHLORIDE 0.9 % IV SOLN: 2.0000 g | INTRAVENOUS | Status: DC

## 2020-02-18 MED ORDER — SODIUM CHLORIDE 0.9 % IV SOLN: 500.0000 mg | INTRAVENOUS | Status: DC

## 2020-02-18 MED ORDER — LACTATED RINGERS IV BOLUS (SEPSIS)
1000.0000 mL | Freq: Once | INTRAVENOUS | Status: AC
  Administered 2020-02-18: 1000 mL via INTRAVENOUS

## 2020-02-18 NOTE — ED Triage Notes (Signed)
Pt to ED via [facility] from [facility] for hypoxia and tachycardia. Pts SpO2 was in the 60s per [facility], per EMS, pt was 86% on room air. Pt is currently on 2 liters and sating at 95%. Pt has been running fever since Wednesday. Per EMS they got 99.4, pt has been using OTC medications. Pt has hx/o lung CA, pt is on radiation. Pt is in NAD.

## 2020-02-18 NOTE — ED Provider Notes (Signed)
[facility] Emergency Department Provider Note  ____________________________________________   I have reviewed the triage vital signs and the nursing notes.   HISTORY  Chief Complaint Shortness of Breath   History limited by: Not Limited   HPI Sally Green is a 74 y.o. female who presents to the emergency department today because of concern for shortness of breath and feeling unwell. Symptoms started 3 days ago. The patient states that she has been feeling weak, has had decreased appetite, weight loss, shortness of breath. Patient denies change to her sense of taste or smell. The patient has had both of her covid shots plus a booster. The patient has had fevers. Has history of COPD but does not have an inhaler at home to try to use. Apparently when patient went to urgent care they found her oxygen level to be in the 60s. Patient was placed on oxygen and states she does feel somewhat better now on the oxygen.  Records reviewed. Per medical record review patient has a history of COPD.  Past Medical History:  Diagnosis Date  . 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 AVM (arteriovenous malformation)   . Cancer ([place]) 2017   lung  . Cerebral hemorrhage ([place])   . Convulsions ([place])   . Depression   . Fracture, foot    Right   . History of colon polyps   . HOH (hard of hearing)    AIDS  . Hyperlipemia   . Hypersomnia   . Lung mass   . Menopausal symptoms   . 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 Osteoporosis, postmenopausal   . 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  Sees dr Tommi Rumps at [facility]  Oct 2014.    . Seizures ([place])    LAST 9/18    Patient Active Problem List   Diagnosis Date Noted  . Localization-related idiopathic epilepsy and epileptic syndromes with seizures of localized onset, not intractable, with status  epilepticus ([place]) 01/04/2018  . Hyperlipidemia 11/24/2017  . Lung mass   . Depression (emotion) 08/17/2014  . 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 Partial seizures with loss of speech ([place]) 08/17/2014  . COPD, severe ([place]) 08/01/2013  . Solitary pulmonary nodule 08/01/2013  . AVM (arteriovenous malformation) brain 07/22/2013  . Other diseases of lung, not elsewhere classified 07/22/2013  . Other forms of epilepsy and recurrent seizures without mention of intractable epilepsy 11/09/2012  . Hypersomnia with sleep apnea, unspecified 05/10/2012  . 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 Encounter for long-term (current) use of medications 05/10/2012  . 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 Diplopia 05/10/2012  . Localization-related epilepsy, intractable ([place]) 05/10/2012    Past Surgical History:  Procedure Laterality Date  . ABDOMINAL HYSTERECTOMY    . BREAST BIOPSY Left 2004   neg  . CATARACT EXTRACTION W/PHACO Right 01/13/2017   Procedure: CATARACT EXTRACTION PHACO AND INTRAOCULAR LENS PLACEMENT (IOC);  Surgeon: Birder Robson, MD;  Location: [facility] ORS;  Service: Ophthalmology;  Laterality: Right;  [place]  00:49.3 AP%  14.7 CDE   7.28 Fluid pack lot # [id number]  . CATARACT EXTRACTION W/PHACO Left 02/03/2017   Procedure: CATARACT EXTRACTION PHACO AND INTRAOCULAR LENS PLACEMENT (IOC);  Surgeon: Birder Robson, MD;  Location: [facility] ORS;  Service: Ophthalmology;  Laterality: Left;  [place] 00:41 AP% 14.3 CDE 6.01 Fluid pack lot # [id number]  .  COLONOSCOPY WITH PROPOFOL N/A 04/10/2017   Procedure: COLONOSCOPY WITH PROPOFOL;  Surgeon: Manya Silvas, MD;  Location: [facility] ENDOSCOPY;  Service: Endoscopy;  Laterality: N/A;  . dental implants    . ELECTROMAGNETIC NAVIGATION BROCHOSCOPY N/A 03/07/2015   Procedure: ELECTROMAGNETIC NAVIGATION BRONCHOSCOPY;  Surgeon: Flora Lipps, MD;  Location: [facility] ORS;  Service:  Cardiopulmonary;  Laterality: N/A;  . EYE SURGERY    . oophrectomy    . SHOULDER SURGERY Left 2007  . VAGINAL DELIVERY     3 children    Prior to Admission medications   Medication Sig Start Date End Date Taking? Authorizing Provider  acetaminophen (TYLENOL) 500 MG tablet Take 500 mg by mouth daily as needed for moderate pain or headache.    [provider]  albuterol (PROVENTIL HFA;VENTOLIN HFA) 108 (90 Base) MCG/ACT inhaler Inhale 2 puffs into the lungs every 6 (six) hours as needed for wheezing or shortness of breath.    [provider]  alendronate (FOSAMAX) 70 MG tablet Take by mouth. 01/22/15   [provider]  atorvastatin (LIPITOR) 40 MG tablet Take 40 mg by mouth every evening.  12/05/14   [provider]  Calcium Carbonate-Vitamin D 600-400 MG-UNIT tablet Take 1 tablet by mouth 2 (two) times daily.  08/16/15 11/24/17  [provider]  DILANTIN 100 MG ER capsule Take 3 capsules (300 mg total) by mouth at bedtime. 03/08/19   Ward Givens, NP  DUREZOL 0.05 % EMUL Place 1 drop into the right eye daily. 01/02/17   [provider]  FLUoxetine (PROZAC) 10 MG capsule Take by mouth. 07/15/17 07/15/18  [provider]  lamoTRIgine (LAMICTAL) 100 MG tablet TAKE 2 1/2 TABLETS BY MOUTH EVERY MORNING AND 3 TABLETS EVERY DAY IN AFTERNOON 12/06/19   Dohmeier, Asencion Partridge, MD    Allergies Depakote [divalproex sodium], Aspirin, Keppra [levetiracetam], Topamax [topiramate], and Guaifenesin  Family History  Problem Relation Age of Onset  . Lymphoma Mother   . Alzheimer's disease Father   . Aneurysm Father   . Headache Father   . Cancer Other   . Aneurysm Paternal Uncle   . Headache Sister   . Cancer Sister        lung  . Cancer Maternal Aunt   . Breast cancer Maternal Aunt   . Cancer Paternal Aunt   . Leukemia Maternal Uncle     Social History Social History   Tobacco Use  . Smoking status: Former Smoker    Packs/day: 1.25     Years: 50.00    Pack years: 62.50    Types: Cigarettes    Quit date: 06/24/2013    Years since quitting: 6.6  . Smokeless tobacco: Never Used  Vaping Use  . Vaping Use: Never used  Substance Use Topics  . Alcohol use: No  . Drug use: No    Review of Systems Constitutional: Positive for fever. Eyes: No visual changes. ENT: No sore throat. Cardiovascular: Denies chest pain. Respiratory: Positive for shortness of breath. Gastrointestinal: Positive for decreased appetite.  Genitourinary: Negative for dysuria. Musculoskeletal: Negative for back pain. Skin: Negative for rash. Neurological: Negative for headaches, focal weakness or numbness.  ____________________________________________   PHYSICAL EXAM:  VITAL SIGNS: ED Triage Vitals  Enc Vitals Group     BP 02/18/20 1328 139/66     Pulse Rate 02/18/20 1324 (!) 103     Resp 02/18/20 1324 18     Temp 02/18/20 1328 98.3 F (36.8 C)     Temp Source 02/18/20  1328 Oral     SpO2 02/18/20 1324 (!) 87 %     Weight 02/18/20 1325 116 lb 6.5 oz (52.8 kg)     Height 02/18/20 1325 5\' 4"  (1.626 m)     Head Circumference --      Peak Flow --      Pain Score 02/18/20 1325 0   Constitutional: Alert and oriented.  Eyes: Conjunctivae are normal.  ENT      Head: Normocephalic and atraumatic.      Nose: No congestion/rhinnorhea.      Mouth/Throat: Mucous membranes are moist.      Neck: No stridor. Hematological/Lymphatic/Immunilogical: No cervical lymphadenopathy. Cardiovascular: Normal rate, regular rhythm.  No murmurs, rubs, or gallops.  Respiratory: Normal respiratory effort without tachypnea nor retractions. Breath sounds are clear and equal bilaterally. Positive for wheezing. Gastrointestinal: Soft and non tender. No rebound. No guarding.  Genitourinary: Deferred Musculoskeletal: Normal range of motion in all extremities. No lower extremity edema. Neurologic:  Normal speech and language. No gross focal neurologic deficits are  appreciated.  Skin:  Skin is warm, dry and intact. No rash noted. Psychiatric: Mood and affect are normal. Speech and behavior are normal. Patient exhibits appropriate insight and judgment.  ____________________________________________    LABS (pertinent positives/negatives)  CBC wbc 15.6, hgb 14.6, plt 253 CMP na 140, k 3.4, glu 118, cr 0.51 COVID negative Lactic acid 0.8 Procalcitonin .32  ____________________________________________   EKG  INance Pear, attending physician, personally viewed and interpreted this EKG  EKG Time: 1322 Rate: 103 Rhythm: sinus tachycardia Axis: normal Intervals: qtc 419 QRS: narrow, q waves v1, v2 ST changes: no st elevation Impression: abnormal ekg  ____________________________________________    RADIOLOGY  CXR Chronic lung disease with patchy interstitial densities.   ____________________________________________   PROCEDURES  Procedures  CRITICAL CARE Performed by: Nance Pear   Total critical care time: 30 minutes  Critical care time was exclusive of separately billable procedures and treating other patients.  Critical care was necessary to treat or prevent imminent or life-threatening deterioration.  Critical care was time spent personally by me on the following activities: development of treatment plan with patient and/or surrogate as well as nursing, discussions with consultants, evaluation of patient's response to treatment, examination of patient, obtaining history from patient or surrogate, ordering and performing treatments and interventions, ordering and review of laboratory studies, ordering and review of radiographic studies, pulse oximetry and re-evaluation of patient's condition.  ____________________________________________   INITIAL IMPRESSION / ASSESSMENT AND PLAN / ED COURSE  Pertinent labs & imaging results that were available during my care of the patient were reviewed by me and considered in  my medical decision making (see chart for details).   Patient presented to the emergency department today with complaint of shortness of breath, feeling unwell for the past few days. Was hypoxic on room air. cxr is concerning for infection. Did have concern for covid given pandemic. However once procalcitonin returned positive did think more likely bacterial pneumonia and patient was started on antibiotics. The patient covid did come back negative. Discussed findings with patient and family. Will plan on admission to the hospitalist service.   ____________________________________________   FINAL CLINICAL IMPRESSION(S) / ED DIAGNOSES  Final diagnoses:  Shortness of breath  Hypoxia  Community acquired pneumonia, unspecified laterality     Note: This dictation was prepared with Sales executive. Any transcriptional errors that result from this process are unintentional     Nance Pear, MD 02/18/20 1816

## 2020-02-18 NOTE — ED Notes (Signed)
Daughter at bedside. Daughter states pt has had dry, hacking cough for past week but this is also normal for her as she is a smoker. States pt has had little appetite and has had weakness for past 3d. She states pt has had "confusion" and has been disoriented beginning 3d ago but it became more noticeable today.

## 2020-02-18 NOTE — ED Notes (Signed)
Drew 2nd set blood cultures sent to lab with temp labels.

## 2020-02-18 NOTE — ED Notes (Signed)
Called pt daughter per pt request to have daughter come back to room.

## 2020-02-18 NOTE — ED Notes (Signed)
EDP at bedside  

## 2020-02-18 NOTE — ED Notes (Addendum)
Pt to ED stating SOB/air hunger and no appetite since about 5d ago. Does not use oxygen at home.SPO2 96% on 2L oxygen per [place]. Denies cough, fevers, NVD. States no hx lung pathology. Current cigarette smoker: 1 cigarette q 3d since 1 year. Did smoke 1pack/day for about 50 years.

## 2020-02-18 NOTE — Consult Note (Signed)
CODE SEPSIS - PHARMACY COMMUNICATION  **Broad Spectrum Antibiotics should be administered within 1 hour of Sepsis diagnosis**  Time Code Sepsis Called/Page Received: 1650  Antibiotics Ordered: ceftriaxone 2 g q24h and azithromyxin 500 mg q24h   Time of 1st antibiotic administration: 9485  Additional action taken by pharmacy: N/A  If necessary, Name of Provider/Nurse Contacted: N/A  Benn Moulder, PharmD Pharmacy Resident  02/18/2020 4:48 PM

## 2020-02-18 NOTE — ED Notes (Signed)
Report attempted x 1

## 2020-02-18 NOTE — H&P (Signed)
History and Physical   Sally Green ZOX:[id number] DOB: 12/18/1945 DOA: 02/18/2020  Referring MD/NP/PA: Dr. Archie Balboa  PCP: Leonel Ramsay, MD   Outpatient Specialists: None  Patient coming from: Home  Chief Complaint: Shortness of breath and cough  HPI: Sally Green is a 74 y.o. female with medical history significant of hypertension, hyperlipidemia, depression, lung mass, osteoporosis, AVMs and seizures who is hard of hearing presented to the ER with significant shortness of breath or cough.  Symptoms have been going on for 3 days.  Associated with some fever and chills.  Also weakness.  Patient denied any sick contact.  She has had cough but nonproductive.  No hemoptysis.  Patient has history of COPD as well but has not been having significant wheezing as far she knows.  She has run out of her inhalers.  Did not take anything therefore for it.  She came to the ER where she was seen and evaluated.  Patient found to have met sepsis criteria with evidence of pneumonia.  She was also found to be hypoxic prior to arrival in the ER when she went to urgent care center.  Oxygen apparently was in the 60s at the time her saturation.  She has been placed on oxygen here and hemodynamically stable.  She is being admitted with acute respiratory failure secondary to pneumonia as well as sepsis..  ED Course: Temperature 99.3 blood pressure 146/57 pulse 103 respirate 33 oxygen sat 87% on room air.  White count is 15.6 hemoglobin 14.7 platelet 253.  Sodium is 140 potassium 3.4 chloride 95 CO2 34 and creatinine 0.51.  Glucose 118 calcium 10.2.  Lactic acid 0.8 and procalcitonin 0.32.  Chest x-ray showed chronic lung changes with patchy interstitial densities mostly right lower lung.  Concerning for pneumonia.  Patient will be admitted with severe sepsis and pneumonia  Review of Systems: As per HPI otherwise 10 point review of systems negative.    Past Medical History:  Diagnosis Date  . AVM  (arteriovenous malformation) 11/1973  . AVM (arteriovenous malformation)   . Cancer ([place]) 2017   lung  . Cerebral hemorrhage ([place])   . Convulsions ([place])   . Depression   . Fracture, foot    Right   . History of colon polyps   . HOH (hard of hearing)    AIDS  . Hyperlipemia   . Hypersomnia   . Lung mass   . Menopausal symptoms   . 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 Osteoporosis, postmenopausal   . 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  [facility] Tommi [facility]  Oct 2014.    . Seizures ([place])    LAST 9/18    Past Surgical History:  Procedure Laterality Date  . ABDOMINAL HYSTERECTOMY    . BREAST BIOPSY Left 2004   neg  . CATARACT EXTRACTION W/PHACO Right 01/13/2017   Procedure: CATARACT EXTRACTION PHACO AND INTRAOCULAR LENS PLACEMENT (IOC);  Surgeon: Birder Robson, MD;  Location: [facility] ORS;  Service: Ophthalmology;  Laterality: Right;  [place]  00:49.3 AP%  14.7 CDE   7.28 Fluid pack lot # [id number]  . CATARACT EXTRACTION W/PHACO Left 02/03/2017   Procedure: CATARACT EXTRACTION PHACO AND INTRAOCULAR LENS PLACEMENT (IOC);  Surgeon: Birder Robson, MD;  Location: [facility] ORS;  Service: Ophthalmology;  Laterality: Left;  [place] 00:41 AP% 14.3 CDE 6.01 Fluid pack lot # [id number]  . COLONOSCOPY WITH  PROPOFOL N/A 04/10/2017   Procedure: COLONOSCOPY WITH PROPOFOL;  Surgeon: Manya Silvas, MD;  Location: [facility] ENDOSCOPY;  Service: Endoscopy;  Laterality: N/A;  . dental implants    . ELECTROMAGNETIC NAVIGATION BROCHOSCOPY N/A 03/07/2015   Procedure: ELECTROMAGNETIC NAVIGATION BRONCHOSCOPY;  Surgeon: Flora Lipps, MD;  Location: [facility] ORS;  Service: Cardiopulmonary;  Laterality: N/A;  . EYE SURGERY    . oophrectomy    . SHOULDER SURGERY Left 2007  . VAGINAL DELIVERY     3 children     reports that she quit smoking about 6 years ago. Her smoking use included  cigarettes. She has a 62.50 pack-year smoking history. She has never used smokeless tobacco. She reports that she does not drink alcohol and does not use drugs.  Allergies  Allergen Reactions  . Depakote [Divalproex Sodium]     Nervous   . Aspirin     Instructed to never take ASA  . Keppra [Levetiracetam]     unknown  . Topamax [Topiramate]     unknown  . Guaifenesin Palpitations    Family History  Problem Relation Age of Onset  . Lymphoma Mother   . Alzheimer's disease Father   . Aneurysm Father   . Headache Father   . Cancer Other   . Aneurysm Paternal Uncle   . Headache Sister   . Cancer Sister        lung  . Cancer Maternal Aunt   . Breast cancer Maternal Aunt   . Cancer Paternal Aunt   . Leukemia Maternal Uncle      Prior to Admission medications   Medication Sig Start Date End Date Taking? Authorizing Provider  acetaminophen (TYLENOL) 500 MG tablet Take 500 mg by mouth daily as needed for moderate pain or headache.    [provider]  albuterol (PROVENTIL HFA;VENTOLIN HFA) 108 (90 Base) MCG/ACT inhaler Inhale 2 puffs into the lungs every 6 (six) hours as needed for wheezing or shortness of breath.    [provider]  alendronate (FOSAMAX) 70 MG tablet Take by mouth. 01/22/15   [provider]  atorvastatin (LIPITOR) 40 MG tablet Take 40 mg by mouth every evening.  12/05/14   [provider]  Calcium Carbonate-Vitamin D 600-400 MG-UNIT tablet Take 1 tablet by mouth 2 (two) times daily.  08/16/15 11/24/17  [provider]  DILANTIN 100 MG ER capsule Take 3 capsules (300 mg total) by mouth at bedtime. 03/08/19   Ward Givens, NP  DUREZOL 0.05 % EMUL Place 1 drop into the right eye daily. 01/02/17   [provider]  FLUoxetine (PROZAC) 10 MG capsule Take by mouth. 07/15/17 07/15/18  [provider]  lamoTRIgine (LAMICTAL) 100 MG tablet TAKE 2 1/2 TABLETS BY MOUTH EVERY MORNING AND 3 TABLETS EVERY DAY IN AFTERNOON  12/06/19   Dohmeier, Asencion Partridge, MD    Physical Exam: Vitals:   02/18/20 1522 02/18/20 1530 02/18/20 1600 02/18/20 1630  BP: (!) 141/61 (!) 137/56 (!) 134/57 (!) 136/59  Pulse: 100 100 95 94  Resp: (!) 27 (!) 27 (!) 26 (!) 24  Temp: 99.1 F (37.3 C)     TempSrc: Oral     SpO2: 95% 97% 97% 98%  Weight:      Height:          Constitutional: NAD, calm, comfortable Vitals:   02/18/20 1522 02/18/20 1530 02/18/20 1600 02/18/20 1630  BP: (!) 141/61 (!) 137/56 (!) 134/57 (!) 136/59  Pulse: 100 100 95 94  Resp: (!)  27 (!) 27 (!) 26 (!) 24  Temp: 99.1 F (37.3 C)     TempSrc: Oral     SpO2: 95% 97% 97% 98%  Weight:      Height:       Eyes: PERRL, lids and conjunctivae normal ENMT: Mucous membranes are moist. Posterior pharynx clear of any exudate or lesions.Normal dentition.  Neck: normal, supple, no masses, no thyromegaly Respiratory: Decreased air entry bilaterally with mild expiratory wheezing no crackles. Normal respiratory effort. No accessory muscle use.  Cardiovascular: Sinus tachycardia, no murmurs / rubs / gallops. No extremity edema. 2+ pedal pulses. No carotid bruits.  Abdomen: no tenderness, no masses palpated. No hepatosplenomegaly. Bowel sounds positive.  Musculoskeletal: no clubbing / cyanosis. No joint deformity upper and lower extremities. Good ROM, no contractures. Normal muscle tone.  Skin: no rashes, lesions, ulcers. No induration Neurologic: CN 2-12 grossly intact. Sensation intact, DTR normal. Strength 5/5 in all 4.  Psychiatric: Normal judgment and insight. Alert and oriented x 3. Normal mood.     Labs on Admission: I have personally reviewed following labs and imaging studies  CBC: Recent Labs  Lab 02/18/20 1332  WBC 15.6*  NEUTROABS 12.5*  HGB 14.6  HCT 44.7  MCV 96.3  PLT 401   Basic Metabolic Panel: Recent Labs  Lab 02/18/20 1332  NA 140  K 3.4*  CL 95*  CO2 34*  GLUCOSE 118*  BUN 22  CREATININE 0.51  CALCIUM 10.2   GFR: Estimated  Creatinine Clearance: 50.6 mL/min (by C-G formula based on SCr of 0.51 mg/dL). Liver Function Tests: Recent Labs  Lab 02/18/20 1332  AST 21  ALT 16  ALKPHOS 127*  BILITOT 0.7  PROT 7.4  ALBUMIN 3.2*   No results for input(s): LIPASE, AMYLASE in the last 168 hours. No results for input(s): AMMONIA in the last 168 hours. Coagulation Profile: No results for input(s): INR, PROTIME in the last 168 hours. Cardiac Enzymes: No results for input(s): CKTOTAL, CKMB, CKMBINDEX, TROPONINI in the last 168 hours. BNP (last 3 results) No results for input(s): PROBNP in the last 8760 hours. HbA1C: No results for input(s): HGBA1C in the last 72 hours. CBG: No results for input(s): GLUCAP in the last 168 hours. Lipid Profile: No results for input(s): CHOL, HDL, LDLCALC, TRIG, CHOLHDL, LDLDIRECT in the last 72 hours. Thyroid Function Tests: No results for input(s): TSH, T4TOTAL, FREET4, T3FREE, THYROIDAB in the last 72 hours. Anemia Panel: No results for input(s): VITAMINB12, FOLATE, FERRITIN, TIBC, IRON, RETICCTPCT in the last 72 hours. Urine analysis: No results found for: COLORURINE, APPEARANCEUR, LABSPEC, [place], GLUCOSEU, HGBUR, BILIRUBINUR, KETONESUR, PROTEINUR, UROBILINOGEN, NITRITE, LEUKOCYTESUR Sepsis Labs: _0 (procalcitonin:4,lacticidven:4) ) Recent Results (from the past 240 hour(s))  Resp Panel by RT-PCR (Flu A&B, Covid) Nasopharyngeal Swab     Status: None   Collection Time: 02/18/20  4:54 PM   Specimen: Nasopharyngeal Swab; Nasopharyngeal(NP) swabs in vial transport medium  Result Value Ref Range Status   SARS Coronavirus 2 by RT PCR NEGATIVE NEGATIVE Final    Comment: (NOTE) SARS-CoV-2 target nucleic acids are NOT DETECTED.  The SARS-CoV-2 RNA is generally detectable in upper respiratory specimens during the acute phase of infection. The lowest concentration of SARS-CoV-2 viral copies this assay can detect is 138 copies/mL. A negative result does not preclude  SARS-Cov-2 infection and should not be used as the sole basis for treatment or other patient management decisions. A negative result may occur with  improper specimen collection/handling, submission of specimen other than nasopharyngeal swab,  presence of viral mutation(s) within the areas targeted by this assay, and inadequate number of viral copies(<138 copies/mL). A negative result must be combined with clinical observations, patient history, and epidemiological information. The expected result is Negative.  Fact Sheet for Patients:  EntrepreneurPulse.com.au  Fact Sheet for Healthcare Providers:  IncredibleEmployment.be  This test is no t yet approved or cleared by the [place] FDA and  has been authorized for detection and/or diagnosis of SARS-CoV-2 by FDA under an Emergency Use Authorization (EUA). This EUA will remain  in effect (meaning this test can be used) for the duration of the COVID-19 declaration under Section 564(b)(1) of the Act, 21 U.S.C.section 360bbb-3(b)(1), unless the authorization is terminated  or revoked sooner.       Influenza A by PCR NEGATIVE NEGATIVE Final   Influenza B by PCR NEGATIVE NEGATIVE Final    Comment: (NOTE) The Xpert Xpress SARS-CoV-2/FLU/RSV plus assay is intended as an aid in the diagnosis of influenza from Nasopharyngeal swab specimens and should not be used as a sole basis for treatment. Nasal washings and aspirates are unacceptable for Xpert Xpress SARS-CoV-2/FLU/RSV testing.  Fact Sheet for Patients: EntrepreneurPulse.com.au  Fact Sheet for Healthcare Providers: IncredibleEmployment.be  This test is not yet approved or cleared by the [place] FDA and has been authorized for detection and/or diagnosis of SARS-CoV-2 by FDA under an Emergency Use Authorization (EUA). This EUA will remain in effect (meaning this test can be used) for the duration of  the COVID-19 declaration under Section 564(b)(1) of the Act, 21 U.S.C. section 360bbb-3(b)(1), unless the authorization is terminated or revoked.  Performed at [facility], [address] [place], [place] [address]   Culture, blood (single)     Status: None (Preliminary result)   Collection Time: 02/18/20  5:03 PM   Specimen: BLOOD  Result Value Ref Range Status   Specimen Description BLOOD LEFT ARM  Final   Special Requests   Final    BOTTLES DRAWN AEROBIC AND ANAEROBIC Blood Culture results may not be optimal due to an excessive volume of blood received in culture bottles Performed at [facility], [address] [place], [place] [address]    Culture PENDING  Incomplete   Report Status PENDING  Incomplete     Radiological Exams on Admission: DG Chest 2 View  Result Date: 02/18/2020 CLINICAL DATA:  74 year old with shortness of breath. History of right lung cancer. History of fever. EXAM: CHEST - 2 VIEW COMPARISON:  Chest CT 10/07/2019 FINDINGS: Mild hyperinflation and known emphysema. Chronic round nodule in the left lower lobe. Chronic densities at the right lung apex are compatible with previously treated lung cancer. Increased interstitial thickening throughout both lungs particularly at the right lung base. No large pleural effusions. Heart size is normal. Aorta is diffusely calcified. IMPRESSION: Chronic lung changes with patchy interstitial densities, most prominent in the right lower lung. Based on the history of fevers, findings are concerning for infection. Asymmetric pulmonary edema would be in the differential diagnosis. Electronically Signed   By: Markus Daft M.D.   On: 02/18/2020 14:25    EKG: Independently reviewed.  Sinus tachycardia  Assessment/Plan Principal Problem:   Sepsis ([place]) Active Problems:   COPD, severe (HCC)   Lung mass   Hyperlipidemia   Community acquired pneumonia   Acute on chronic respiratory failure with hypoxemia (HCC)    Hypokalemia     #1 sepsis with endorgan failure: Patient meets sepsis criteria with heart rate respiratory rate leukocytosis.  Also evidence of pneumonia on x-ray.  She has hypoxia making his severe sepsis.  We will admit the patient and initiate IV antibiotics fluid resuscitation and sepsis protocol.  #2 community-acquired pneumonia: Consistent with findings above.  IV Rocephin and Zithromax will be initiated.  COVID-19 is negative  #3 COPD: Mild exacerbation at most.  Will consider steroids if not improved overnight.  #4 hypokalemia: Replete potassium.  #5 lung mass: Continue outpatient follow-up with PCP on oncology  #6 acute hypoxic respiratory failure: Secondary to pneumonia above.  Continue with cultures and supportive care.  #7 hyperlipidemia: Continue home regimen.    DVT prophylaxis: Lovenox Code Status: Full code Family Communication: No family at bedside Disposition Plan: Home Consults called: None Admission status: Inpatient  Severity of Illness: The appropriate patient status for this patient is INPATIENT. Inpatient status is judged to be reasonable and necessary in order to provide the required intensity of service to ensure the patient's safety. The patient's presenting symptoms, physical exam findings, and initial radiographic and laboratory data in the context of their chronic comorbidities is felt to place them at high risk for further clinical deterioration. Furthermore, it is not anticipated that the patient will be medically stable for discharge from the hospital within 2 midnights of admission. The following factors support the patient status of inpatient.   " The patient's presenting symptoms include shortness of breath and cough. " The worrisome physical exam findings include mild expiratory wheezing. " The initial radiographic and laboratory data are worrisome because of possible pneumonia with sepsis criteria. " The chronic co-morbidities include  COPD.   * I certify that at the point of admission it is my clinical judgment that the patient will require inpatient hospital care spanning beyond 2 midnights from the point of admission due to high intensity of service, high risk for further deterioration and high frequency of surveillance required.Barbette Merino MD [facility] Pager [phone]  If 7PM-7AM, please contact night-coverage [email or web address] Password [facility]  02/18/2020, 6:57 PM

## 2020-02-18 NOTE — ED Notes (Signed)
Pt SPO2 fulctuating between 90-94% and dipped to 88%. SPO2 sensor was moved to ear. O2 titrated up to 4L per [place]. SPO2 now 95%.

## 2020-02-19 ENCOUNTER — Inpatient Hospital Stay: Payer: Medicare Other

## 2020-02-19 ENCOUNTER — Encounter: Payer: Self-pay | Admitting: Internal Medicine

## 2020-02-19 DIAGNOSIS — R0602 Shortness of breath: Secondary | ICD-10-CM

## 2020-02-19 DIAGNOSIS — R0902 Hypoxemia: Secondary | ICD-10-CM

## 2020-02-19 DIAGNOSIS — A419 Sepsis, unspecified organism: Secondary | ICD-10-CM | POA: Diagnosis not present

## 2020-02-19 DIAGNOSIS — J9621 Acute and chronic respiratory failure with hypoxia: Secondary | ICD-10-CM | POA: Diagnosis not present

## 2020-02-19 DIAGNOSIS — J449 Chronic obstructive pulmonary disease, unspecified: Secondary | ICD-10-CM | POA: Diagnosis not present

## 2020-02-19 LAB — COMPREHENSIVE METABOLIC PANEL
ALT: 14 U/L (ref 0–44)
AST: 21 U/L (ref 15–41)
Albumin: 2.5 g/dL — ABNORMAL LOW (ref 3.5–5.0)
Alkaline Phosphatase: 116 U/L (ref 38–126)
Anion gap: 13 (ref 5–15)
BUN: 12 mg/dL (ref 8–23)
CO2: 31 mmol/L (ref 22–32)
Calcium: 8.9 mg/dL (ref 8.9–10.3)
Chloride: 99 mmol/L (ref 98–111)
Creatinine, Ser: 0.4 mg/dL — ABNORMAL LOW (ref 0.44–1.00)
GFR, Estimated: >60 mL/min (ref >60)
Glucose, Bld: 86 mg/dL (ref 70–99)
Potassium: 3 mmol/L — ABNORMAL LOW (ref 3.5–5.1)
Sodium: 143 mmol/L (ref 135–145)
Total Bilirubin: 0.7 mg/dL (ref 0.3–1.2)
Total Protein: 6.1 g/dL — ABNORMAL LOW (ref 6.5–8.1)

## 2020-02-19 LAB — URINALYSIS, COMPLETE (UACMP) WITH MICROSCOPIC
Bilirubin Urine: NEGATIVE
Glucose, UA: 150 mg/dL — AB
Ketones, ur: 5 mg/dL — AB
Nitrite: NEGATIVE
Protein, ur: NEGATIVE mg/dL
Specific Gravity, Urine: >1.046 — ABNORMAL HIGH (ref 1.005–1.030)
pH: 5 (ref 5.0–8.0)

## 2020-02-19 LAB — MAGNESIUM: Magnesium: 1.6 mg/dL — ABNORMAL LOW (ref 1.7–2.4)

## 2020-02-19 LAB — CBC WITH DIFFERENTIAL/PLATELET
Abs Immature Granulocytes: 0.07 10*3/uL (ref 0.00–0.07)
Basophils Absolute: 0.1 10*3/uL (ref 0.0–0.1)
Basophils Relative: 0 %
Eosinophils Absolute: 0 10*3/uL (ref 0.0–0.5)
Eosinophils Relative: 0 %
HCT: 43.4 % (ref 36.0–46.0)
Hemoglobin: 14 g/dL (ref 12.0–15.0)
Immature Granulocytes: 1 %
Lymphocytes Relative: 13 %
Lymphs Abs: 1.6 10*3/uL (ref 0.7–4.0)
MCH: 31.7 pg (ref 26.0–34.0)
MCHC: 32.3 g/dL (ref 30.0–36.0)
MCV: 98.2 fL (ref 80.0–100.0)
Monocytes Absolute: 1.6 10*3/uL — ABNORMAL HIGH (ref 0.1–1.0)
Monocytes Relative: 13 %
Neutro Abs: 9.1 10*3/uL — ABNORMAL HIGH (ref 1.7–7.7)
Neutrophils Relative %: 73 %
Platelets: 218 10*3/uL (ref 150–400)
RBC: 4.42 MIL/uL (ref 3.87–5.11)
RDW: 14 % (ref 11.5–15.5)
WBC: 12.5 10*3/uL — ABNORMAL HIGH (ref 4.0–10.5)
nRBC: 0 % (ref 0.0–0.2)

## 2020-02-19 LAB — BRAIN NATRIURETIC PEPTIDE: B Natriuretic Peptide: 198.5 pg/mL — ABNORMAL HIGH (ref 0.0–100.0)

## 2020-02-19 LAB — HIV ANTIBODY (ROUTINE TESTING W REFLEX): HIV Screen 4th Generation wRfx: NONREACTIVE

## 2020-02-19 MED ORDER — FLUTICASONE PROPIONATE 50 MCG/ACT NA SUSP
1.0000 | Freq: Every day | NASAL | Status: DC
  Administered 2020-02-19 – 2020-02-20 (×2): 1 via NASAL
  Filled 2020-02-19: qty 16

## 2020-02-19 MED ORDER — IOHEXOL 300 MG/ML  SOLN
75.0000 mL | Freq: Once | INTRAMUSCULAR | Status: AC | PRN
  Administered 2020-02-19: 75 mL via INTRAVENOUS

## 2020-02-19 MED ORDER — ATORVASTATIN CALCIUM 20 MG PO TABS
40.0000 mg | ORAL_TABLET | Freq: Every evening | ORAL | Status: DC
  Administered 2020-02-19 – 2020-02-20 (×2): 40 mg via ORAL
  Filled 2020-02-19 (×2): qty 2

## 2020-02-19 MED ORDER — LAMOTRIGINE 100 MG PO TABS: 250.0000 mg | ORAL_TABLET | Freq: Two times a day (BID) | ORAL | Status: DC

## 2020-02-19 MED ORDER — POTASSIUM CHLORIDE CRYS ER 20 MEQ PO TBCR
40.0000 meq | EXTENDED_RELEASE_TABLET | ORAL | Status: AC
  Administered 2020-02-19 (×2): 40 meq via ORAL
  Filled 2020-02-19 (×2): qty 2

## 2020-02-19 MED ORDER — MAGNESIUM SULFATE 2 GM/50ML IV SOLN
2.0000 g | Freq: Once | INTRAVENOUS | Status: AC
  Administered 2020-02-19: 2 g via INTRAVENOUS
  Filled 2020-02-19: qty 50

## 2020-02-19 MED ORDER — PHENYTOIN SODIUM EXTENDED 100 MG PO CAPS
300.0000 mg | ORAL_CAPSULE | Freq: Every day | ORAL | Status: DC
  Administered 2020-02-19 – 2020-02-20 (×2): 300 mg via ORAL
  Filled 2020-02-19 (×3): qty 3

## 2020-02-19 MED ORDER — LAMOTRIGINE 25 MG PO TABS
250.0000 mg | ORAL_TABLET | Freq: Every morning | ORAL | Status: DC
  Administered 2020-02-20 – 2020-02-21 (×2): 250 mg via ORAL

## 2020-02-19 MED ORDER — ENSURE ENLIVE PO LIQD
237.0000 mL | Freq: Two times a day (BID) | ORAL | Status: DC
  Administered 2020-02-20 (×2): 237 mL via ORAL

## 2020-02-19 MED ORDER — LAMOTRIGINE 100 MG PO TABS
300.0000 mg | ORAL_TABLET | Freq: Every evening | ORAL | Status: DC
  Administered 2020-02-19 – 2020-02-20 (×2): 300 mg via ORAL
  Filled 2020-02-19 (×4): qty 3

## 2020-02-19 NOTE — Progress Notes (Signed)
PROGRESS NOTE    Sally Green  IRJ:[id number] DOB: 11/07/1945 DOA: 02/18/2020 PCP: Leonel Ramsay, MD   Brief Narrative: Taken from H&P. Sally Green is a 74 y.o. female with medical history significant of hypertension, hyperlipidemia, depression, lung mass, osteoporosis, AVMs and seizures who is hard of hearing presented to the ER with significant shortness of breath or cough.  Symptoms have been going on for 3 days.   There was some subjective fever and chills. Per patient there is no recent change in her chronic cough.  She was having this nonproductive cough for a long time.  History of lung cancer which is being treated few years ago.  Patient to follow-up with oncology yearly. Daughter was at bedside and according to her that they have noticed recent significant weight loss, overall declining health, generalized weakness and decreased appetite.  Patient continues to smoke.  She does not use oxygen at home at baseline.  She was found to be hypoxic requiring 4 L of oxygen to maintain saturation in low 90s.  Chest x-ray with chronic lung changes and some patchy interstitial densities more on the right for concern of pneumonia/asymmetric pulmonary edema.  Procalcitonin elevated at 0.32.  Subjective: Her shortness of breath improved with supplemental oxygen.  She denies any recent change in her chronic cough.  Continues to smoke.  Remained afebrile.  She do endorse recent weight loss and decreased in her appetite. Daughter was at bedside.  Assessment & Plan:   Principal Problem:   Sepsis ([place]) Active Problems:   COPD, severe ([place])   Lung mass   Hyperlipidemia   Community acquired pneumonia   Acute on chronic respiratory failure with hypoxemia (HCC)   Hypokalemia  Acute hypoxic respiratory failure.  Patient does not use oxygen at baseline.  She was admitted as sepsis with pneumonia as she met sepsis criteria with tachycardia, tachypnea, leukocytosis and concern of pneumonia  on chest x-ray.  Mildly elevated procalcitonin.  Strep pneumo and Legionella are pending.  Blood cultures remain negative.  No urinary symptoms. Not sure whether she really has pneumonia as she does not has any worsening of her chronic cough or other upper respiratory symptoms. Patient has an history of right upper lobe lung cancer which was treated with radiation few years ago.  Might be a disease progression?? Recent CT chest done in July 2021 with some questionable bandlike increase in opacities in right upper lobe.  Stable left lower lobe nodule and liver cirrhosis. -Repeat CT chest with contrast. -Check BNP -Continue with ceftriaxone and Zithromax at this time. -Continue to monitor. -Continue supplemental oxygen to keep saturation above 90%.  COPD/emphysema.  No wheezing. -No need for steroid at this time. -Continue with as needed bronchodilators.  Hypokalemia/hypomagnesemia.  Potassium of 3 with magnesium of 1.6. -Replete electrolytes and monitor.  Seizure disorder.  No acute concern. -Continue home dose of Dilantin and Lamictal.  Hyperlipidemia. -Continue home statin.  Objective: Vitals:   02/18/20 2044 02/19/20 0552 02/19/20 0804 02/19/20 1143  BP: (!) 146/57 (!) 142/59 (!) 139/59 (!) 116/48  Pulse: 94 81 99 83  Resp: (!) 22 20 (!) 22 18  Temp: 99.3 F (37.4 C) 98.8 F (37.1 C) 98.2 F (36.8 C) 98.1 F (36.7 C)  TempSrc:    Oral  SpO2: 97% 92% 92% 99%  Weight:      Height:        Intake/Output Summary (Last 24 hours) at 02/19/2020 1256 Last data filed at 02/19/2020 0136 Gross per  24 hour  Intake 2669.04 ml  Output --  Net 2669.04 ml   Filed Weights   02/18/20 1325  Weight: 52.8 kg    Examination:  General exam: Frail elderly lady, appears calm and comfortable  Respiratory system: Decreased breath sound at right upper and mid zone. Respiratory effort normal. Cardiovascular system: S1 & S2 heard, RRR.  Gastrointestinal system: Soft, nontender,  nondistended, bowel sounds positive. Central nervous system: Alert and oriented. No focal neurological deficits.Symmetric 5 x 5 power. Extremities: No edema, no cyanosis, pulses intact and symmetrical. Psychiatry: Judgement and insight appear normal. Mood & affect appropriate.    DVT prophylaxis: Lovenox Code Status: Full Family Communication: Discussed with patient and daughter at bedside. Disposition Plan:  Status is: Inpatient  Remains inpatient appropriate because:Inpatient level of care appropriate due to severity of illness   Dispo: The patient is from: Home              Anticipated d/c is to: Home              Anticipated d/c date is: 1 day              Patient currently is not medically stable to d/c.   Consultants:   Pulmonary  Procedures:  Antimicrobials:  Ceftriaxone Zithromax  Data Reviewed: I have personally reviewed following labs and imaging studies  CBC: Recent Labs  Lab 02/18/20 1332 02/19/20 0522  WBC 15.6* 12.5*  NEUTROABS 12.5* 9.1*  HGB 14.6 14.0  HCT 44.7 43.4  MCV 96.3 98.2  PLT 253 361   Basic Metabolic Panel: Recent Labs  Lab 02/18/20 1332 02/19/20 0522  NA 140 143  K 3.4* 3.0*  CL 95* 99  CO2 34* 31  GLUCOSE 118* 86  BUN 22 12  CREATININE 0.51 0.40*  CALCIUM 10.2 8.9  MG  --  1.6*   GFR: Estimated Creatinine Clearance: 50.6 mL/min (A) (by C-G formula based on SCr of 0.4 mg/dL (L)). Liver Function Tests: Recent Labs  Lab 02/18/20 1332 02/19/20 0522  AST 21 21  ALT 16 14  ALKPHOS 127* 116  BILITOT 0.7 0.7  PROT 7.4 6.1*  ALBUMIN 3.2* 2.5*   No results for input(s): LIPASE, AMYLASE in the last 168 hours. No results for input(s): AMMONIA in the last 168 hours. Coagulation Profile: No results for input(s): INR, PROTIME in the last 168 hours. Cardiac Enzymes: No results for input(s): CKTOTAL, CKMB, CKMBINDEX, TROPONINI in the last 168 hours. BNP (last 3 results) No results for input(s): PROBNP in the last 8760  hours. HbA1C: No results for input(s): HGBA1C in the last 72 hours. CBG: No results for input(s): GLUCAP in the last 168 hours. Lipid Profile: No results for input(s): CHOL, HDL, LDLCALC, TRIG, CHOLHDL, LDLDIRECT in the last 72 hours. Thyroid Function Tests: No results for input(s): TSH, T4TOTAL, FREET4, T3FREE, THYROIDAB in the last 72 hours. Anemia Panel: No results for input(s): VITAMINB12, FOLATE, FERRITIN, TIBC, IRON, RETICCTPCT in the last 72 hours. Sepsis Labs: Recent Labs  Lab 02/18/20 1332 02/18/20 1654  PROCALCITON 0.32  --   LATICACIDVEN  --  0.8    Recent Results (from the past 240 hour(s))  Resp Panel by RT-PCR (Flu A&B, Covid) Nasopharyngeal Swab     Status: None   Collection Time: 02/18/20  4:54 PM   Specimen: Nasopharyngeal Swab; Nasopharyngeal(NP) swabs in vial transport medium  Result Value Ref Range Status   SARS Coronavirus 2 by RT PCR NEGATIVE NEGATIVE Final    Comment: (NOTE)  SARS-CoV-2 target nucleic acids are NOT DETECTED.  The SARS-CoV-2 RNA is generally detectable in upper respiratory specimens during the acute phase of infection. The lowest concentration of SARS-CoV-2 viral copies this assay can detect is 138 copies/mL. A negative result does not preclude SARS-Cov-2 infection and should not be used as the sole basis for treatment or other patient management decisions. A negative result may occur with  improper specimen collection/handling, submission of specimen other than nasopharyngeal swab, presence of viral mutation(s) within the areas targeted by this assay, and inadequate number of viral copies(<138 copies/mL). A negative result must be combined with clinical observations, patient history, and epidemiological information. The expected result is Negative.  Fact Sheet for Patients:  EntrepreneurPulse.com.au  Fact Sheet for Healthcare Providers:  IncredibleEmployment.be  This test is no t yet approved or  cleared by the [place] FDA and  has been authorized for detection and/or diagnosis of SARS-CoV-2 by FDA under an Emergency Use Authorization (EUA). This EUA will remain  in effect (meaning this test can be used) for the duration of the COVID-19 declaration under Section 564(b)(1) of the Act, 21 U.S.C.section 360bbb-3(b)(1), unless the authorization is terminated  or revoked sooner.       Influenza A by PCR NEGATIVE NEGATIVE Final   Influenza B by PCR NEGATIVE NEGATIVE Final    Comment: (NOTE) The Xpert Xpress SARS-CoV-2/FLU/RSV plus assay is intended as an aid in the diagnosis of influenza from Nasopharyngeal swab specimens and should not be used as a sole basis for treatment. Nasal washings and aspirates are unacceptable for Xpert Xpress SARS-CoV-2/FLU/RSV testing.  Fact Sheet for Patients: EntrepreneurPulse.com.au  Fact Sheet for Healthcare Providers: IncredibleEmployment.be  This test is not yet approved or cleared by the [place] FDA and has been authorized for detection and/or diagnosis of SARS-CoV-2 by FDA under an Emergency Use Authorization (EUA). This EUA will remain in effect (meaning this test can be used) for the duration of the COVID-19 declaration under Section 564(b)(1) of the Act, 21 U.S.C. section 360bbb-3(b)(1), unless the authorization is terminated or revoked.  Performed at [facility], [address] [place], [place] [address]   Culture, blood (single)     Status: None (Preliminary result)   Collection Time: 02/18/20  5:03 PM   Specimen: BLOOD  Result Value Ref Range Status   Specimen Description BLOOD LEFT ARM  Final   Special Requests   Final    BOTTLES DRAWN AEROBIC AND ANAEROBIC Blood Culture results may not be optimal due to an excessive volume of blood received in culture bottles   Culture  Setup Time PENDING  Incomplete   Culture   Final    NO GROWTH < 24 HOURS Performed at [facility], [address] [place], [place] [address]    Report Status PENDING  Incomplete  Culture, blood (routine x 2) Call MD if unable to obtain prior to antibiotics being given     Status: None (Preliminary result)   Collection Time: 02/18/20  5:29 PM   Specimen: BLOOD  Result Value Ref Range Status   Specimen Description BLOOD BLOOD RIGHT ARM  Final   Special Requests   Final    BOTTLES DRAWN AEROBIC AND ANAEROBIC Blood Culture results may not be optimal due to an excessive volume of blood received in culture bottles   Culture   Final    NO GROWTH < 12 HOURS Performed at [facility], [address] [place], [place] [address]    Report  Status PENDING  Incomplete     Radiology Studies: DG Chest 2 View  Result Date: 02/18/2020 CLINICAL DATA:  74 year old with shortness of breath. History of right lung cancer. History of fever. EXAM: CHEST - 2 VIEW COMPARISON:  Chest CT 10/07/2019 FINDINGS: Mild hyperinflation and known emphysema. Chronic round nodule in the left lower lobe. Chronic densities at the right lung apex are compatible with previously treated lung cancer. Increased interstitial thickening throughout both lungs particularly at the right lung base. No large pleural effusions. Heart size is normal. Aorta is diffusely calcified. IMPRESSION: Chronic lung changes with patchy interstitial densities, most prominent in the right lower lung. Based on the history of fevers, findings are concerning for infection. Asymmetric pulmonary edema would be in the differential diagnosis. Electronically Signed   By: Markus Daft M.D.   On: 02/18/2020 14:25    Scheduled Meds: . atorvastatin  40 mg Oral QPM  . enoxaparin (LOVENOX) injection  40 mg Subcutaneous Q24H  . fluticasone  1 spray Each Nare Daily  . [START ON 02/20/2020] lamoTRIgine  250 mg Oral q AM  . lamoTRIgine  300 mg Oral QPM  . phenytoin  300 mg Oral QHS   Continuous Infusions: . sodium chloride 125 mL/hr at  02/19/20 0136  . azithromycin Stopped (02/18/20 1836)  . cefTRIAXone (ROCEPHIN)  IV Stopped (02/18/20 1746)  . magnesium sulfate bolus IVPB 2 g (02/19/20 1247)     LOS: 1 day   Time spent: 35 minutes.  Lorella Nimrod, MD [facility]  If 7PM-7AM, please contact night-coverage [email or web address]  02/19/2020, 12:56 PM   This record has been created using Systems analyst. Errors have been sought and corrected,but may not always be located. Such creation errors do not reflect on the standard of care.

## 2020-02-20 DIAGNOSIS — A419 Sepsis, unspecified organism: Secondary | ICD-10-CM | POA: Diagnosis not present

## 2020-02-20 DIAGNOSIS — J189 Pneumonia, unspecified organism: Secondary | ICD-10-CM | POA: Diagnosis not present

## 2020-02-20 DIAGNOSIS — J9621 Acute and chronic respiratory failure with hypoxia: Secondary | ICD-10-CM | POA: Diagnosis not present

## 2020-02-20 DIAGNOSIS — R0602 Shortness of breath: Secondary | ICD-10-CM | POA: Diagnosis not present

## 2020-02-20 LAB — BASIC METABOLIC PANEL
Anion gap: 9 (ref 5–15)
BUN: 8 mg/dL (ref 8–23)
CO2: 34 mmol/L — ABNORMAL HIGH (ref 22–32)
Calcium: 8.3 mg/dL — ABNORMAL LOW (ref 8.9–10.3)
Chloride: 100 mmol/L (ref 98–111)
Creatinine, Ser: 0.39 mg/dL — ABNORMAL LOW (ref 0.44–1.00)
GFR, Estimated: >60 mL/min (ref >60)
Glucose, Bld: 97 mg/dL (ref 70–99)
Potassium: 3.6 mmol/L (ref 3.5–5.1)
Sodium: 143 mmol/L (ref 135–145)

## 2020-02-20 LAB — CBC
HCT: 39.4 % (ref 36.0–46.0)
Hemoglobin: 12.8 g/dL (ref 12.0–15.0)
MCH: 31.7 pg (ref 26.0–34.0)
MCHC: 32.5 g/dL (ref 30.0–36.0)
MCV: 97.5 fL (ref 80.0–100.0)
Platelets: 225 10*3/uL (ref 150–400)
RBC: 4.04 MIL/uL (ref 3.87–5.11)
RDW: 14 % (ref 11.5–15.5)
WBC: 8.7 10*3/uL (ref 4.0–10.5)
nRBC: 0 % (ref 0.0–0.2)

## 2020-02-20 LAB — MAGNESIUM: Magnesium: 1.8 mg/dL (ref 1.7–2.4)

## 2020-02-20 LAB — STREP PNEUMONIAE URINARY ANTIGEN: Strep Pneumo Urinary Antigen: NEGATIVE

## 2020-02-20 MED ORDER — CEFDINIR 300 MG PO CAPS: 300.0000 mg | ORAL_CAPSULE | Freq: Two times a day (BID) | ORAL | 0 refills | Status: AC

## 2020-02-20 MED ORDER — ENSURE ENLIVE PO LIQD: 237.0000 mL | Freq: Two times a day (BID) | ORAL | 12 refills | Status: AC

## 2020-02-20 MED ORDER — ADULT MULTIVITAMIN W/MINERALS CH
1.0000 | ORAL_TABLET | Freq: Every day | ORAL | Status: DC
  Administered 2020-02-21: 1 via ORAL
  Filled 2020-02-20: qty 1

## 2020-02-20 MED ORDER — ENSURE ENLIVE PO LIQD
237.0000 mL | Freq: Two times a day (BID) | ORAL | Status: DC
  Administered 2020-02-20 – 2020-02-21 (×3): 237 mL via ORAL

## 2020-02-20 MED ORDER — AZITHROMYCIN 250 MG PO TABS: 250.0000 mg | ORAL_TABLET | Freq: Every day | ORAL | 0 refills | Status: AC

## 2020-02-20 NOTE — Progress Notes (Signed)
Mobility Specialist - Progress Note   02/20/20 1200  Mobility  Activity Ambulated in hall  Level of Assistance Contact guard assist, steadying assist  Assistive Device Front wheel walker  Distance Ambulated (ft) 100 ft  Mobility Response Tolerated well  Mobility performed by Mobility specialist  $Mobility charge 1 Mobility    Pre-mobility: 87 HR, 98% SpO2 During mobility: 94 HR,86% SpO2 Post-mobility: 87 HR, 92% SpO2   Pt was sleeping in bed upon arrival utilizing 4L [place] O2. Pt's daughter present in room. Pt agreed to session. Pt denied pain and nausea, but did c/o fatigue. Pt was able to get EOB SBA, denying dizziness. Pt stood to RW. Noticed pt soiled self, pt able to pivot to [facility] for urinal/BM output. Mobility assisted with hygiene tasks. Pt stood from [facility] and c/o feeling lightheaded, pt sat back EOB for a few minutes to resolve issue. Pt stood to RW a second attempt with progression to ambulation. Pt ambulated 40' on 4L, O2 sat in high 90s. RN joined [employer] and weaned pt off O2. Pt ambulated an additional 40', O2 desat to 86%. Pt c/o slight SOB. Mobility tech went over PLB technique with pt as standing rest break was taken. O2 was bumped up to 2L for sats to increase to 93%. During break, pt c/o feeling lightheaded and weak in extremities. Wheel chair was provided by nurse in hallway to escort pt back to room. Pt returned EOB-supine with dizziness resolving itself with rest. Overall, pt tolerated session well. Pt was left in bed with all needs in reach and alarm set. With nurse approval, pt currently utilizing 2L prior to exit. Pt's daughter at bedside concerned about pt not being ready for discharge.    Kathee Delton Mobility Specialist 02/20/20, 12:18 PM

## 2020-02-20 NOTE — Progress Notes (Signed)
Initial Nutrition Assessment  DOCUMENTATION CODES:   Not applicable  INTERVENTION:   Ensure Enlive po BID, each supplement provides 350 kcal and 20 grams of protein  MVI daily   Liberalize diet   Pt at high refeed risk; recommend monitor potassium, magnesium and phosphorus labs daily until stable  NUTRITION DIAGNOSIS:   Increased nutrient needs related to catabolic illness, COPD, lung mass as evidenced by increased estimated needs.  GOAL:   Patient will meet greater than or equal to 90% of their needs  MONITOR:   PO intake, Supplement acceptance, Labs, Weight trends, I & O's, Skin  REASON FOR ASSESSMENT:   Consult Assessment of nutrition requirement/status  ASSESSMENT:   74 y.o. female with medical history significant of hypertension, hyperlipidemia, depression, lung mass, COPD, osteoporosis, AVMs and seizures who is hard of hearing presented to the ER with significant shortness of breath or cough.   Visited pt's room today. Unable to obtain nutrition related history or exam as family member at bedside requested that RD let pt sleep. Family member at bedside reports pt with decreased appetite and oral intake for several days pta. Pt had not yet eaten lunch today as family member reports that pt has been sleeping for hours. Pt did eat some chick-fil-A for breakfast this morning. Per family member report, pt does drink vanilla or strawberry Ensure (reports chocolate causes diarrhea). RD will add supplements and MVI to help pt meet her estimated needs. RD will also liberalize pt's diet as a heart healthy diet is restrictive of protein. Per chart, pt is down 5lbs(4%) from her UBW. Family member confirms this weight loss and reports that it has occurred over the past week; this would be significant weight loss. Pt at high risk for malnutrition but unable to perform at this time as NFPE cannot be completed; will attempt to obtain at follow-up.   Medications reviewed and include:  lovenox, MVI, NaCl @125ml /hr, azithromycin, ceftriaxone   Labs reviewed: creat 0.39(L)  NUTRITION - FOCUSED PHYSICAL EXAM: Unable to perform at this time   Diet Order:   Diet Order            Diet regular Room service appropriate? Yes; Fluid consistency: Thin  Diet effective now           Diet - low sodium heart healthy                EDUCATION NEEDS:   Education needs have been addressed (with pt's family)  Skin:  Skin Assessment: Reviewed RN Assessment  Last BM:  pta  Height:   Ht Readings from Last 1 Encounters:  02/18/20 5\' 4"  (1.626 m)    Weight:   Wt Readings from Last 1 Encounters:  02/18/20 52.8 kg    Ideal Body Weight:  54.5 kg  BMI:  Body mass index is 19.98 kg/m.  Estimated Nutritional Needs:   Kcal:  1400-1600kcal/day  Protein:  70-80g/day  Fluid:  1.3-1.6L/day  Koleen Distance MS, RD, LDN Please refer to [facility] for RD and/or RD on-call/weekend/after hours pager

## 2020-02-20 NOTE — Discharge Summary (Addendum)
Physician Discharge Summary  Sally Green CNO:[id number] DOB: 05-Jul-1945 DOA: 02/18/2020  PCP: Leonel Ramsay, MD  Admit date: 02/18/2020 Discharge date: 02/21/2020  Admitted From: Home Disposition: SNF  Recommendations for Outpatient Follow-up:  1. Follow up with PCP in 1-2 weeks 2. Please obtain BMP/CBC in one week 3. Please follow up on the following pending results: None  Home Health: No Equipment/Devices: Home oxygen Discharge Condition: Stable CODE STATUS: Full Diet recommendation: Heart Healthy   Brief/Interim Summary: Sally Green a 74 y.o.femalewith medical history significant ofhypertension, hyperlipidemia, depression, lung mass, osteoporosis, AVMs and seizures who is hard of hearing presented to the ER with significant shortness of breath or cough. Symptoms have been going on for 3 days.  There was some subjective fever and chills. Per patient there is no recent change in her chronic cough.  She was having this nonproductive cough for a long time.  History of lung cancer which is being treated few years ago.  Patient to follow-up with oncology yearly. Daughter was at bedside and according to her that they have noticed recent significant weight loss, overall declining health, generalized weakness and decreased appetite.  Patient continues to smoke.  She does not use oxygen at home at baseline.  She was found to be hypoxic and initially requiring up to 4 L of oxygen.  Oxygenation improved somewhat and she was ambulated but do desaturate on room air and continue to require 2 L of oxygen.  She was discharged home with oxygen and will follow up with her pulmonologist for further recommendations.  Chest x-ray with chronic lung changes and some patchy interstitial densities more on the right for concern of pneumonia/asymmetric pulmonary edema.  Procalcitonin elevated at 0.32.  CT chest with contrast was done due to concern of progression of her disease which shows  stable right upper lobe and left lower lobe nodules with some new infiltrate and nodularity in bilateral bases which was more consistent with inflammatory changes than malignancy.  There was also a small sclerotic region at T8 vertebra. She received ceftriaxone and Zithromax while in the hospital and discharged on cefdinir and Zithromax as she was also complaining of some burning micturition.  UA with some leukocytes and bacteria.  Urine cultures was not done as she was already on antibiotics and current regimen should be able to take care of her UTI if it is there. Patient initially met sepsis criteria but there is no clear source of infection so sepsis ruled out.  Patient also found to have borderline elevation of calcium.  She was taking calcium supplements which were discontinued on discharge.  Primary care provider can follow-up appropriately.  Patient was initially discharged to home with home health services.  Daughter was concerned that she might not be able to take care of herself and she [facility].  Patient is being discharged to SNF at her request.  We will continue with rest of her home medications and follow-up with her primary care provider.  Discharge Diagnoses:  Principal Problem: Shortness of breath Active Problems:   COPD, severe (HCC)   Lung mass   Hyperlipidemia   Community acquired pneumonia   Acute on chronic respiratory failure with hypoxemia (HCC)   Hypokalemia   Hypoxia   Shortness of breath   Discharge Instructions  Discharge Instructions    Diet - low sodium heart healthy   Complete by: As directed    Discharge instructions   Complete by: As directed  It was pleasure taking care of you. You are being given antibiotics for 4 more days, please take it as directed. At this time you need 2 L of oxygen all the time, please use it as directed and follow-up with your primary care provider or pulmonologist for further  recommendations. As we discussed you need imaging in 4 to 6 weeks to ensure resolution of your pneumonia. Please keep yourself well-hydrated and follow-up with your providers.   Increase activity slowly   Complete by: As directed    Increase activity slowly   Complete by: As directed      Allergies as of 02/21/2020      Reactions   Depakote [divalproex Sodium]    Nervous    Aspirin    Instructed to never take ASA   Keppra [levetiracetam]    unknown   Topamax [topiramate]    unknown   Guaifenesin Palpitations      Medication List    STOP taking these medications   Calcium Carbonate-Vitamin D 600-400 MG-UNIT tablet     TAKE these medications   acetaminophen 500 MG tablet Commonly known as: TYLENOL Take 500 mg by mouth daily as needed for moderate pain or headache.   albuterol 108 (90 Base) MCG/ACT inhaler Commonly known as: VENTOLIN HFA Inhale 2 puffs into the lungs every 6 (six) hours as needed for wheezing or shortness of breath.   atorvastatin 40 MG tablet Commonly known as: LIPITOR Take 40 mg by mouth every evening.   azithromycin 250 MG tablet Commonly known as: Zithromax Z-Pak Take 1 tablet (250 mg total) by mouth daily for 4 days.   cefdinir 300 MG capsule Commonly known as: OMNICEF Take 1 capsule (300 mg total) by mouth 2 (two) times daily for 4 days.   Dilantin 100 MG ER capsule Generic drug: phenytoin Take 3 capsules (300 mg total) by mouth at bedtime.   feeding supplement Liqd Take 237 mLs by mouth 2 (two) times daily between meals.   feeding supplement Liqd Take 237 mLs by mouth 2 (two) times daily between meals. Start taking on: February 22, 2020   fluticasone 50 MCG/ACT nasal spray Commonly known as: FLONASE Place 1 spray into the nose 2 (two) times daily.   lamoTRIgine 100 MG tablet Commonly known as: LAMICTAL TAKE 2 1/2 TABLETS BY MOUTH EVERY MORNING AND 3 TABLETS EVERY DAY IN AFTERNOON What changed: See the new instructions.    multivitamin with minerals Tabs tablet Take 1 tablet by mouth daily. Start taking on: February 22, 2020            Durable Medical Equipment  (From admission, onward)         Start     Ordered   02/20/20 1505  For home use only DME Walker rolling  Once       Question Answer Comment  Walker: With [employer] Wheels   Patient needs a walker to treat with the following condition Weakness      02/20/20 1505   02/20/20 1322  For home use only DME oxygen  Once       Question Answer Comment  Length of Need Lifetime   Mode or (Route) Nasal cannula   Liters per Minute 2   Frequency Continuous (stationary and portable oxygen unit needed)   Oxygen conserving device Yes   Oxygen delivery system Gas      02/20/20 1321          Contact information for follow-up providers  Leonel Ramsay, MD. Daphane Shepherd on 02/22/2020.   Specialty: Infectious Diseases Why: Go at 9:45am. Contact information: [facility] [place] [address] [phone]        [facility] Follow up.   Why: They will follow up with you for your home health needs. Contact information: [place] [address] [phone]            Contact information for after-discharge care    Destination    [facility] Preferred SNF .   Service: Skilled Nursing Contact information: [place] [phone]                 Allergies  Allergen Reactions  . Depakote [Divalproex Sodium]     Nervous   . Aspirin     Instructed to never take ASA  . Keppra [Levetiracetam]     unknown  . Topamax [Topiramate]     unknown  . Guaifenesin Palpitations    Consultations:  None  Procedures/Studies: DG Chest 2 View  Result Date: 02/18/2020 CLINICAL DATA:  74 year old with shortness of breath. History of right lung cancer. History of fever. EXAM: CHEST - 2 VIEW COMPARISON:  Chest CT 10/07/2019  FINDINGS: Mild hyperinflation and known emphysema. Chronic round nodule in the left lower lobe. Chronic densities at the right lung apex are compatible with previously treated lung cancer. Increased interstitial thickening throughout both lungs particularly at the right lung base. No large pleural effusions. Heart size is normal. Aorta is diffusely calcified. IMPRESSION: Chronic lung changes with patchy interstitial densities, most prominent in the right lower lung. Based on the history of fevers, findings are concerning for infection. Asymmetric pulmonary edema would be in the differential diagnosis. Electronically Signed   By: Markus Daft M.D.   On: 02/18/2020 14:25   CT CHEST W CONTRAST  Result Date: 02/19/2020 CLINICAL DATA:  Cough, fever, sepsis, PNA, hx of lung cancer^25m OMNIPAQUE IOHEXOL 300 MG/ML SOLNRespiratory failure EXAM: CT CHEST WITH CONTRAST TECHNIQUE: Multidetector CT imaging of the chest was performed during intravenous contrast administration. CONTRAST:  722mOMNIPAQUE IOHEXOL 300 MG/ML  SOLN COMPARISON:  CT 7 9,021 FINDINGS: Cardiovascular: Coronary artery calcification and aortic atherosclerotic calcification. Mediastinum/Nodes: No axillary or supraclavicular adenopathy. Trachea and esophagus are normal. Stable small mediastinal lymph nodes. Subcarinal node measuring 10 mm compared to 10 mm. RIGHT lower paratracheal node measuring 8 mm compared to 8 mm. Lungs/Pleura: Again demonstrated irregular nodule in the RIGHT upper lobe with central air bronchograms measuring 3.1 by 2.3 cm unchanged from 3.0 x 2.1 cm on prior remeasured. Rounded nodule in the LEFT lower lobe measuring 2.2 cm is also unchanged. There is however, new bilateral lower lobe peribronchial nodular densities with mild ground-glass borders. There is mild peribronchial thickening. New bilateral pleural effusions associated with the nodularity. Upper Abdomen: Limited view of the liver, kidneys, pancreas are unremarkable. Normal  adrenal glands. Musculoskeletal: No aggressive osseous lesion. Round sclerotic lesion in the T8 vertebral body is found on comparison exams. IMPRESSION: 1. New bibasilar peribronchial thickening and airspace nodularity with small effusions. Findings are most consistent with bilateral lower lobe multifocal pneumonia. Would consider atypical pneumonia given the bilateral presentation. Drug reaction or metastatic process is less favored. 2. Stable RIGHT upper lobe mass and LEFT lower lobe nodule. 3. Small mediastinal lymph nodes are similar prior. 4. Coronary artery calcification and Aortic Atherosclerosis (ICD10-I70.0). Electronically Signed   By: StHelane Gunther.  On: 02/19/2020 15:46    Subjective: Patient was seen and examined today.  Patient was feeling overall weak.  Daughter at bedside and she was concerned that she might not be able to take care of herself at home even with home health services.  She wants to her to go to SNF and states that she will try herself.  Discharge Exam: Vitals:   02/21/20 1154 02/21/20 1212  BP:  140/62  Pulse:  83  Resp:  18  Temp:  98.1 F (36.7 C)  SpO2: 92% 91%   Vitals:   02/21/20 0506 02/21/20 0754 02/21/20 1154 02/21/20 1212  BP: 119/60 135/62  140/62  Pulse: 86 84  83  Resp: 16   18  Temp: 98.2 F (36.8 C) 98.1 F (36.7 C)  98.1 F (36.7 C)  TempSrc: Oral Oral  Oral  SpO2: 95% 97% 92% 91%  Weight:      Height:        General: Pt is alert, awake, not in acute distress Cardiovascular: RRR, S1/S2 +, no rubs, no gallops Respiratory: CTA bilaterally, no wheezing, no rhonchi Abdominal: Soft, NT, ND, bowel sounds + Extremities: no edema, no cyanosis   The results of significant diagnostics from this hospitalization (including imaging, microbiology, ancillary and laboratory) are listed below for reference.    Microbiology: Recent Results (from the past 240 hour(s))  Resp Panel by RT-PCR (Flu A&B, Covid) Nasopharyngeal Swab     Status:  None   Collection Time: 02/18/20  4:54 PM   Specimen: Nasopharyngeal Swab; Nasopharyngeal(NP) swabs in vial transport medium  Result Value Ref Range Status   SARS Coronavirus 2 by RT PCR NEGATIVE NEGATIVE Final    Comment: (NOTE) SARS-CoV-2 target nucleic acids are NOT DETECTED.  The SARS-CoV-2 RNA is generally detectable in upper respiratory specimens during the acute phase of infection. The lowest concentration of SARS-CoV-2 viral copies this assay can detect is 138 copies/mL. A negative result does not preclude SARS-Cov-2 infection and should not be used as the sole basis for treatment or other patient management decisions. A negative result may occur with  improper specimen collection/handling, submission of specimen other than nasopharyngeal swab, presence of viral mutation(s) within the areas targeted by this assay, and inadequate number of viral copies(<138 copies/mL). A negative result must be combined with clinical observations, patient history, and epidemiological information. The expected result is Negative.  Fact Sheet for Patients:  EntrepreneurPulse.com.au  Fact Sheet for Healthcare Providers:  IncredibleEmployment.be  This test is no t yet approved or cleared by the [place] FDA and  has been authorized for detection and/or diagnosis of SARS-CoV-2 by FDA under an Emergency Use Authorization (EUA). This EUA will remain  in effect (meaning this test can be used) for the duration of the COVID-19 declaration under Section 564(b)(1) of the Act, 21 U.S.C.section 360bbb-3(b)(1), unless the authorization is terminated  or revoked sooner.       Influenza A by PCR NEGATIVE NEGATIVE Final   Influenza B by PCR NEGATIVE NEGATIVE Final    Comment: (NOTE) The Xpert Xpress SARS-CoV-2/FLU/RSV plus assay is intended as an aid in the diagnosis of influenza from Nasopharyngeal swab specimens and should not be used as a sole basis for  treatment. Nasal washings and aspirates are unacceptable for Xpert Xpress SARS-CoV-2/FLU/RSV testing.  Fact Sheet for Patients: EntrepreneurPulse.com.au  Fact Sheet for Healthcare Providers: IncredibleEmployment.be  This test is not yet approved or cleared by the [place] FDA and has been authorized for detection and/or  diagnosis of SARS-CoV-2 by FDA under an Emergency Use Authorization (EUA). This EUA will remain in effect (meaning this test can be used) for the duration of the COVID-19 declaration under Section 564(b)(1) of the Act, 21 U.S.C. section 360bbb-3(b)(1), unless the authorization is terminated or revoked.  Performed at [facility], [address] [place], [place] [address]   Culture, blood (single)     Status: None (Preliminary result)   Collection Time: 02/18/20  5:03 PM   Specimen: BLOOD  Result Value Ref Range Status   Specimen Description BLOOD LEFT ARM  Final   Special Requests   Final    BOTTLES DRAWN AEROBIC AND ANAEROBIC Blood Culture results may not be optimal due to an excessive volume of blood received in culture bottles   Culture  Setup Time PENDING  Incomplete   Culture   Final    NO GROWTH 3 DAYS Performed at [facility], [address], [place], [place] [address]    Report Status PENDING  Incomplete  Culture, blood (routine x 2) Call MD if unable to obtain prior to antibiotics being given     Status: None (Preliminary result)   Collection Time: 02/18/20  5:29 PM   Specimen: BLOOD  Result Value Ref Range Status   Specimen Description BLOOD BLOOD RIGHT ARM  Final   Special Requests   Final    BOTTLES DRAWN AEROBIC AND ANAEROBIC Blood Culture results may not be optimal due to an excessive volume of blood received in culture bottles   Culture   Final    NO GROWTH 3 DAYS Performed at [facility], [address] [place], [place] [address]    Report Status PENDING  Incomplete      Labs: BNP (last 3 results) Recent Labs    02/19/20 0522  BNP 146.0*   Basic Metabolic Panel: Recent Labs  Lab 02/18/20 1332 02/19/20 0522 02/20/20 0551  NA 140 143 143  K 3.4* 3.0* 3.6  CL 95* 99 100  CO2 34* 31 34*  GLUCOSE 118* 86 97  BUN '22 12 8  ' CREATININE 0.51 0.40* 0.39*  CALCIUM 10.2 8.9 8.3*  MG  --  1.6* 1.8   Liver Function Tests: Recent Labs  Lab 02/18/20 1332 02/19/20 0522  AST 21 21  ALT 16 14  ALKPHOS 127* 116  BILITOT 0.7 0.7  PROT 7.4 6.1*  ALBUMIN 3.2* 2.5*   No results for input(s): LIPASE, AMYLASE in the last 168 hours. No results for input(s): AMMONIA in the last 168 hours. CBC: Recent Labs  Lab 02/18/20 1332 02/19/20 0522 02/20/20 0551  WBC 15.6* 12.5* 8.7  NEUTROABS 12.5* 9.1*  --   HGB 14.6 14.0 12.8  HCT 44.7 43.4 39.4  MCV 96.3 98.2 97.5  PLT 253 218 225   Cardiac Enzymes: No results for input(s): CKTOTAL, CKMB, CKMBINDEX, TROPONINI in the last 168 hours. BNP: Invalid input(s): POCBNP CBG: No results for input(s): GLUCAP in the last 168 hours. D-Dimer No results for input(s): DDIMER in the last 72 hours. Hgb A1c No results for input(s): HGBA1C in the last 72 hours. Lipid Profile No results for input(s): CHOL, HDL, LDLCALC, TRIG, CHOLHDL, LDLDIRECT in the last 72 hours. Thyroid function studies No results for input(s): TSH, T4TOTAL, T3FREE, THYROIDAB in the last 72 hours.  Invalid input(s): FREET3 Anemia work up No results for input(s): VITAMINB12, FOLATE, FERRITIN, TIBC, IRON, RETICCTPCT in the last 72 hours. Urinalysis    Component Value Date/Time   COLORURINE YELLOW (  A) 02/19/2020 1450   APPEARANCEUR HAZY (A) 02/19/2020 1450   LABSPEC >1.046 (H) 02/19/2020 1450   PHURINE 5.0 02/19/2020 1450   GLUCOSEU 150 (A) 02/19/2020 1450   HGBUR MODERATE (A) 02/19/2020 1450   BILIRUBINUR NEGATIVE 02/19/2020 1450   KETONESUR 5 (A) 02/19/2020 1450   PROTEINUR NEGATIVE 02/19/2020 1450   NITRITE NEGATIVE 02/19/2020 1450    LEUKOCYTESUR TRACE (A) 02/19/2020 1450   Sepsis Labs Invalid input(s): PROCALCITONIN,  WBC,  LACTICIDVEN Microbiology Recent Results (from the past 240 hour(s))  Resp Panel by RT-PCR (Flu A&B, Covid) Nasopharyngeal Swab     Status: None   Collection Time: 02/18/20  4:54 PM   Specimen: Nasopharyngeal Swab; Nasopharyngeal(NP) swabs in vial transport medium  Result Value Ref Range Status   SARS Coronavirus 2 by RT PCR NEGATIVE NEGATIVE Final    Comment: (NOTE) SARS-CoV-2 target nucleic acids are NOT DETECTED.  The SARS-CoV-2 RNA is generally detectable in upper respiratory specimens during the acute phase of infection. The lowest concentration of SARS-CoV-2 viral copies this assay can detect is 138 copies/mL. A negative result does not preclude SARS-Cov-2 infection and should not be used as the sole basis for treatment or other patient management decisions. A negative result may occur with  improper specimen collection/handling, submission of specimen other than nasopharyngeal swab, presence of viral mutation(s) within the areas targeted by this assay, and inadequate number of viral copies(<138 copies/mL). A negative result must be combined with clinical observations, patient history, and epidemiological information. The expected result is Negative.  Fact Sheet for Patients:  EntrepreneurPulse.com.au  Fact Sheet for Healthcare Providers:  IncredibleEmployment.be  This test is no t yet approved or cleared by the [place] FDA and  has been authorized for detection and/or diagnosis of SARS-CoV-2 by FDA under an Emergency Use Authorization (EUA). This EUA will remain  in effect (meaning this test can be used) for the duration of the COVID-19 declaration under Section 564(b)(1) of the Act, 21 U.S.C.section 360bbb-3(b)(1), unless the authorization is terminated  or revoked sooner.       Influenza A by PCR NEGATIVE NEGATIVE Final    Influenza B by PCR NEGATIVE NEGATIVE Final    Comment: (NOTE) The Xpert Xpress SARS-CoV-2/FLU/RSV plus assay is intended as an aid in the diagnosis of influenza from Nasopharyngeal swab specimens and should not be used as a sole basis for treatment. Nasal washings and aspirates are unacceptable for Xpert Xpress SARS-CoV-2/FLU/RSV testing.  Fact Sheet for Patients: EntrepreneurPulse.com.au  Fact Sheet for Healthcare Providers: IncredibleEmployment.be  This test is not yet approved or cleared by the [place] FDA and has been authorized for detection and/or diagnosis of SARS-CoV-2 by FDA under an Emergency Use Authorization (EUA). This EUA will remain in effect (meaning this test can be used) for the duration of the COVID-19 declaration under Section 564(b)(1) of the Act, 21 U.S.C. section 360bbb-3(b)(1), unless the authorization is terminated or revoked.  Performed at [facility], [address] [place], [place] [address]   Culture, blood (single)     Status: None (Preliminary result)   Collection Time: 02/18/20  5:03 PM   Specimen: BLOOD  Result Value Ref Range Status   Specimen Description BLOOD LEFT ARM  Final   Special Requests   Final    BOTTLES DRAWN AEROBIC AND ANAEROBIC Blood Culture results may not be optimal due to an excessive volume of blood received in culture bottles   Culture  Setup Time PENDING  Incomplete   Culture  Final    NO GROWTH 3 DAYS Performed at [facility], [address] [place], [place] [address]    Report Status PENDING  Incomplete  Culture, blood (routine x 2) Call MD if unable to obtain prior to antibiotics being given     Status: None (Preliminary result)   Collection Time: 02/18/20  5:29 PM   Specimen: BLOOD  Result Value Ref Range Status   Specimen Description BLOOD BLOOD RIGHT ARM  Final   Special Requests   Final    BOTTLES DRAWN AEROBIC AND ANAEROBIC Blood Culture results  may not be optimal due to an excessive volume of blood received in culture bottles   Culture   Final    NO GROWTH 3 DAYS Performed at [facility], [address], [place], [place] [address]    Report Status PENDING  Incomplete    Time coordinating discharge: Over 30 minutes  SIGNED:  Lorella Nimrod, MD  [facility] 02/21/2020, 2:56 PM  If 7PM-7AM, please contact night-coverage [email or web address]  This record has been created using Systems analyst. Errors have been sought and corrected,but may not always be located. Such creation errors do not reflect on the standard of care.

## 2020-02-20 NOTE — TOC Transition Note (Addendum)
Transition of Care [facility]) - CM/SW Discharge Note   Patient Details  Name: MARYJO RAGON MRN: [id number] Date of Birth: 03-17-1946  Transition of Care [facility]) CM/SW Contact:  Candie Chroman, LCSW Phone Number: 02/20/2020, 3:20 PM   Clinical Narrative:  CSW met with patient and daughter at bedside. Patient sleeping/lethargic throughout assessment. CSW introduced role and explained that discharge planning would be discussed. Patient will discharge on new oxygen which was ordered through [facility]. Daughter concerned patient is not at her baseline, unable to walk. CSW explained that per mobility tech note, patient walked 100 feet but daughter concerned because patient is typically independent and she required a walker with mobility tech today. Patient does not have any DME at home. CSW also ordered walker through [employer]. CSW made MD aware of daughter's concerns. She said patient wants to discharge and there is no medical reason to keep her here another night. Daughter updated. CSW made daughter aware of home health recommendations for PT, RN, and aide. Reviewed CMS scores for agencies that serve her zip code. [facility] was first preference but they would be unable to see her until next week. Second preference was [facility]. They have accepted referral and will call daughter tonight to discuss first visit tomorrow afternoon. No further concerns. Patient has orders to discharge home today. Her daughter will take her home once oxygen and walker have been delivered to the room. CSW signing off.  3:58 pm: Per MD, plan to keep patient another night due to oxygen requirements/hypoxia. Updated [place] and [facility] representatives.  Final next level of care: Home w Home Health Services Barriers to Discharge: No Barriers Identified   Patient Goals and CMS Choice   CMS Medicare.gov Compare Post Acute Care list provided to:: Other (Comment Required) (Daughter at bedside.) Choice offered to /  list presented to : Patient, Adult Children  Discharge Placement                       Discharge Plan and Services     Post Acute Care Choice: Durable Medical Equipment, Home Health          DME Arranged: Oxygen, Walker rolling DME Agency: [employer] Date DME Agency Contacted: 02/20/20   Representative spoke with at DME Agency: Nash Shearer HH Arranged: RN, PT, Nurse's Aide [facility] Agency: [place] Date [place]: 02/20/20   Representative spoke with at [place]: [place] ([place]) Interventions     Readmission Risk Interventions No flowsheet data found.

## 2020-02-20 NOTE — Progress Notes (Signed)
02/20/2020. Now SATURATION QUALIFICATIONS: (This note is used to comply with regulatory documentation for home oxygen)  Patient Saturations on Room Air at Rest = 98%  Patient Saturations on Room Air while Ambulating = 86%  Patient Saturations on 2Liters of oxygen while Ambulating = 93%  Please briefly explain why patient needs home oxygen: desaturation during ambulation

## 2020-02-20 NOTE — Evaluation (Signed)
Physical Therapy Evaluation Patient Details Name: Sally Green MRN: [id number] DOB: 28-Jul-1945 Today's Date: 02/20/2020   History of Present Illness  Sally Green is a 19yoF who [facility] c SOB, cough, fever adn chills. Pt admitted with sepsis 2/2 PNA. PMH: HTN, HLD, depression, lung mass, osteoporosis, AVM, sezirues, HOH. At baseline pt is fully independent, however does not AMB farther than what is required for IADL.  Clinical Impression  Pt admitted with above diagnosis. Pt currently with functional limitations due to the deficits listed below (see "PT Problem List"). Upon entry, pt in bed, awake and agreeable to participate- DTR at bedside. The pt is alert and oriented x4, pleasant, conversational, and generally a good historian, but does have some obvious cognitive limitations at times, some of which improve as O2 sats are brought back into the 90s%, but pt does struggle with following instructions for pursed lip breathing. Pt on 2L at entry, note SpO2 85%, then bumped to 4L, later stable at 94%. Pt takes part in gait based balance training at bedside, but requires 6L for adequate saturation, as on 4L, drops to 88% SpO2. Functional mobility assessment demonstrates increased effort/time requirements, fair tolerance, and need for intermittent physical assistance, whereas the patient performed these at a higher level of independence PTA. Pt typically is full independent, does not use O2 at home, does not use assistive device, typically is not confused. DME rep arrives to drop off home O2 concentrator, reports 3L is the highest session. MD/RN contacted to inform that pt's current needs exceed what home equipment can meet, concerning for pending DC this date. Pt will benefit from skilled PT intervention to increase independence and safety with basic mobility in preparation for discharge to the venue listed below.       Follow Up Recommendations Home health PT;Supervision for  mobility/OOB    Equipment Recommendations  Rolling walker with 5" wheels    Recommendations for Other Services       Precautions / Restrictions Precautions Precautions: Fall Restrictions Weight Bearing Restrictions: No      Mobility  Bed Mobility Overal bed mobility: Modified Independent                  Transfers Overall transfer level: Needs assistance Equipment used: 1 person hand held assist Transfers: Sit to/from Stand Sit to Stand: Min guard         General transfer comment: feels weak, unsteady standing  Ambulation/Gait Ambulation/Gait assistance: Min guard Gait Distance (Feet): 30 Feet Assistive device: None Gait Pattern/deviations: WFL(Within Functional Limits)     General Gait Details: AMB at bedside, 4L O2 donned, 88% SpO2, then moved to 6L for AMB  Stairs            Wheelchair Mobility    Modified Rankin (Stroke Patients Only)       Balance Overall balance assessment: Needs assistance Sitting-balance support: No upper extremity supported;Feet supported Sitting balance-Leahy Scale: Good     Standing balance support: No upper extremity supported;During functional activity Standing balance-Leahy Scale: Fair Standing balance comment: LOB with direction changes, start/stop, turns, side stepping, retro AMB                             Pertinent Vitals/Pain Pain Assessment: No/denies pain    Home Living Family/patient expects to be discharged to:: Private residence Living Arrangements: Alone Available Help at Discharge: Family Type of Home: House Home Access: Stairs to enter (could  go in front door with 1 step) Entrance Stairs-Rails: Right Entrance Stairs-Number of Steps: 10 Home Layout: One level Home Equipment: None      Prior Function Level of Independence: Independent         Comments: still independne twith IADL     Hand Dominance   Dominant Hand: Right    Extremity/Trunk Assessment   Upper  Extremity Assessment Upper Extremity Assessment: Overall WFL for tasks assessed    Lower Extremity Assessment Lower Extremity Assessment: Overall WFL for tasks assessed       Communication   Communication: No difficulties  Cognition Arousal/Alertness: Awake/alert   Overall Cognitive Status: Impaired/Different from baseline                                 General Comments: a bit slurred of speech, dorwsy with some improvement as sats improve. difficulty folowing cues for pursed lip breathign education.      General Comments      Exercises Other Exercises Other Exercises: Lateral side stepping a bedside x8 no UE support, minGuard Assist Other Exercises: Retro stepping at bedside for AMB x5   Assessment/Plan    PT Assessment Patient needs continued PT services  PT Problem List Decreased strength;Decreased range of motion;Decreased activity tolerance;Decreased balance;Decreased mobility       PT Treatment Interventions DME instruction;Balance training;Gait training;Stair training;Functional mobility training;Therapeutic activities;Therapeutic exercise    PT Goals (Current goals can be found in the Care Plan section)  Acute Rehab PT Goals Patient Stated Goal: have more energy, be less tired. PT Goal Formulation: With patient Time For Goal Achievement: 03/05/20 Potential to Achieve Goals: Good    Frequency Min 2X/week   Barriers to discharge        Co-evaluation               AM-PAC PT "6 Clicks" Mobility  Outcome Measure Help needed turning from your back to your side while in a flat bed without using bedrails?: A Little Help needed moving from lying on your back to sitting on the side of a flat bed without using bedrails?: A Little Help needed moving to and from a bed to a chair (including a wheelchair)?: A Little Help needed standing up from a chair using your arms (e.g., wheelchair or bedside chair)?: A Little Help needed to walk in hospital  room?: A Little Help needed climbing 3-5 steps with a railing? : A Little 6 Click Score: 18    End of Session Equipment Utilized During Treatment: Gait belt Activity Tolerance: Patient tolerated treatment well;No increased pain Patient left: in bed;with family/visitor present;with call bell/phone within reach;with bed alarm set Nurse Communication: Mobility status PT Visit Diagnosis: Unsteadiness on feet (R26.81);Difficulty in walking, not elsewhere classified (R26.2);Muscle weakness (generalized) (M62.81)    Time: 0321-2248 PT Time Calculation (min) (ACUTE ONLY): 30 min   Charges:   PT Evaluation $PT Eval Moderate Complexity: 1 Mod PT Treatments $Neuromuscular Re-education: 8-22 mins        4:14 PM, 02/20/20 Etta Grandchild, PT, DPT Physical Therapist - [facility]  [phone] ([place])    Avaleen Brownley C 02/20/2020, 4:09 PM

## 2020-02-20 NOTE — Clinical Social Work Note (Signed)
Per MD, patient will need oxygen at discharge. Ordered through [employer].  Sally Green, [place]

## 2020-02-21 DIAGNOSIS — E876 Hypokalemia: Secondary | ICD-10-CM | POA: Diagnosis not present

## 2020-02-21 DIAGNOSIS — E785 Hyperlipidemia, unspecified: Secondary | ICD-10-CM | POA: Diagnosis not present

## 2020-02-21 DIAGNOSIS — A419 Sepsis, unspecified organism: Secondary | ICD-10-CM | POA: Diagnosis not present

## 2020-02-21 DIAGNOSIS — J189 Pneumonia, unspecified organism: Secondary | ICD-10-CM | POA: Diagnosis not present

## 2020-02-21 LAB — RESP PANEL BY RT-PCR (FLU A&B, COVID) ARPGX2
Influenza A by PCR: NEGATIVE
Influenza B by PCR: NEGATIVE
SARS Coronavirus 2 by RT PCR: NEGATIVE

## 2020-02-21 MED ORDER — ACETAMINOPHEN 325 MG PO TABS
650.0000 mg | ORAL_TABLET | Freq: Four times a day (QID) | ORAL | Status: DC | PRN
  Administered 2020-02-21: 650 mg via ORAL
  Filled 2020-02-21: qty 2

## 2020-02-21 MED ORDER — ENSURE ENLIVE PO LIQD: 237.0000 mL | Freq: Two times a day (BID) | ORAL | 12 refills | Status: AC

## 2020-02-21 MED ORDER — ADULT MULTIVITAMIN W/MINERALS CH: 1.0000 | ORAL_TABLET | Freq: Every day | ORAL | Status: AC

## 2020-02-21 NOTE — TOC Transition Note (Signed)
Transition of Care [facility]) - CM/SW Discharge Note   Patient Details  Name: Sally Green MRN: [id number] Date of Birth: [birth year]  Transition of Care [facility]) CM/SW Contact:  Candie Chroman, LCSW Phone Number: 02/21/2020, 4:09 PM   Clinical Narrative:  Patient has orders to discharge to [facility] today. RN will call report to [phone]. EMS transport has been arranged and patient is second on the list. No further concerns. CSW signing off.   Final next level of care: Skilled Nursing Facility Barriers to Discharge: Barriers Resolved   Patient Goals and CMS Choice   CMS Medicare.gov Compare Post Acute Care list provided to:: Other (Comment Required) (Daughter at bedside.) Choice offered to / list presented to : Patient, Adult Children  Discharge Placement PASRR number recieved: 02/21/20            Patient chooses bed at: [facility] [place] Patient to be transferred to facility by: EMS Name of family member notified: Virgel Gess Patient and family notified of of transfer: 02/21/20  Discharge Plan and Services     Post Acute Care Choice: Durable Medical Equipment, Home Health          DME Arranged: Oxygen, Walker rolling DME Agency: [employer] Date DME Agency Contacted: 02/20/20   Representative spoke with at DME Agency: Nash Shearer HH Arranged: RN, PT, Nurse's Aide [facility] Agency: [place] Date [place]: 02/20/20   Representative spoke with at [place]: [place] ([place]) Interventions     Readmission Risk Interventions No flowsheet data found.

## 2020-02-21 NOTE — TOC Progression Note (Addendum)
Transition of Care [facility]) - Progression Note    Patient Details  Name: Sally Green MRN: [id number] Date of Birth: 28-Mar-1946  Transition of Care [facility]) CM/SW [place], LCSW Phone Number: 02/21/2020, 11:55 AM  Clinical Narrative: Daughter now wanting to see if patient qualifies for SNF placement. She has already spoken to staff at [employer]. CSW spoke with admissions coordinator. Patient will need SNF recommendation before they can accept. PT supposed to work with her today. Sent message to PT to update. PASARR under manual review. Sent secure chat to MD about signing 30-day note and FL2.   12:58 pm: Uploaded requested documents into [place] [employer] for PASARR review.  1:39 pm: PASARR obtained: [id number] A.  2:42 pm: [facility] can accept patient today once COVID results are in. Patient sleeping. Daughter has been updated and said patient is eager to go to rehab.  Expected Discharge Plan: [place] Barriers to Discharge: No Barriers Identified  Expected Discharge Plan and Services Expected Discharge Plan: [place] Choice: Durable Medical Equipment, Home Health Living arrangements for the past 2 months: Single Family Home Expected Discharge Date: 02/20/20               DME Arranged: Oxygen, Walker rolling DME Agency: [employer] Date DME Agency Contacted: 02/20/20   Representative spoke with at DME Agency: Nash Shearer HH Arranged: RN, PT, Nurse's Aide [facility] Agency: [place] Date [place]: 02/20/20   Representative spoke with at [place]: [place] ([place]) Interventions    Readmission Risk Interventions No flowsheet data found.

## 2020-02-21 NOTE — Progress Notes (Signed)
Mobility Specialist - Progress Note   02/21/20 1100  Mobility  Activity Ambulated in hall  Level of Assistance Contact guard assist, steadying assist  Assistive Device Front wheel walker  Distance Ambulated (ft) 150 ft  Mobility Response Tolerated well  Mobility performed by Mobility specialist  $Mobility charge 1 Mobility    Pre-mobility (2L): 85 HR, 87% SpO2 Pre-mobility (3.5L): 85 HR, 94% SpO2 During mobility (1L): 91 HR, 88% SpO2 Post-mobility (3.5L): 87 HR, 93% SpO2   Pt was sleeping in bed upon arrival utilizing 2L [place] O2. Pt agreed to session with daughter present in room. Pt c/o pain in L shoulder but was not limited for mobility. Pt's VS were taken upon arrival, O2 desat to 87% at rest. Mobility increased oxygen to 3.5L, O2 sat up to 94%. Pt was able to get EOB with SBA. Pt stood to RW with minA, denying dizziness upon standing. Pt progressed to ambulation in hallway CGA. Pt's O2 was monitored throughout session:  4L - 98%,  3L - 95%,  2L - 92%,  1L - 88% Pt required seated/standing breaks every 25' during activity. 2 seated breaks were taken as daughter assisted with chair follow. Pt would c/o fatigue, weakness, and SOB throughout session. Pt ambulates at decreased speed. No LOB noted this session. Overall, pt tolerated session well. Pt was left in bed with all needs in reach. Nurse entered at the end of session and was notified of performance.    Kathee Delton Mobility Specialist 02/21/20, 12:01 PM

## 2020-02-21 NOTE — Progress Notes (Signed)
Physical Therapy Treatment Patient Details Name: Sally Green MRN: [id number] DOB: Dec 05, 1945 Today's Date: 02/21/2020    History of Present Illness Sally Green is a 66yoF who [facility] c SOB, cough, fever adn chills. Pt admitted with sepsis 2/2 PNA. PMH: HTN, HLD, depression, lung mass, osteoporosis, AVM, sezirues, HOH. At baseline pt is fully independent, however does not AMB farther than what is required for IADL.    PT Comments    Pt was long sitting in bed upon arriving with 1 L o2 donned. Sao2 88%.  She agrees to PT session and is cooperative throughout. She does present with cognition deficits and poor safety awareness. Supportive daughter present and able to give accurate PLOF/home environment. Reports cognition has been progressively getting worse lately. Pt lives alone and is unsafe to return home I'l. Family unable to provide 24 hour assist currently. Pt required increase to 4 L o2 to maintain > 90 %. Due to pt living home alone and requiring increase in O2 demand, recommend DC to SNF for safety. Will benefit from continued skilled PT to address deficits with strength, endurance, and poor safety awareness. At conclusion of session, pt was in bed with call bell in reach and daughter at bedside. CM informed that PT recommendations have been changed.    Follow Up Recommendations  SNF     Equipment Recommendations  Rolling walker with 5" wheels    Recommendations for Other Services       Precautions / Restrictions Precautions Precautions: Fall Restrictions Weight Bearing Restrictions: No    Mobility  Bed Mobility Overal bed mobility: Needs Assistance Bed Mobility: Supine to Sit;Sit to Supine     Supine to sit: Supervision Sit to supine: Supervision   General bed mobility comments: Pt needed Vcs for improved technique and safety.   Transfers Overall transfer level: Needs assistance Equipment used: Rolling walker (2 wheeled) Transfers: Sit to/from  Stand Sit to Stand: Min guard;Min assist         General transfer comment: CGA for safety to stand from EOB. min assist required to stand to sit for eccentric controlled lowering  Ambulation/Gait Ambulation/Gait assistance: Min guard Gait Distance (Feet): 50 Feet Assistive device: Rolling walker (2 wheeled) Gait Pattern/deviations: WFL(Within Functional Limits) Gait velocity: decreased   General Gait Details: pt ambulated 2 x ~ 50 ft with CGA + chair follow. pt fatigues quickly and required increase form 1 L o2 [place] to 4 L o2 to maintain > 90%       Balance Overall balance assessment: Needs assistance Sitting-balance support: No upper extremity supported;Feet supported Sitting balance-Leahy Scale: Good     Standing balance support: During functional activity;Bilateral upper extremity supported Standing balance-Leahy Scale: Fair Standing balance comment: pt does have some slight unsteadiness with ambulation and standing activity         Cognition Arousal/Alertness: Awake/alert Behavior During Therapy: WFL for tasks assessed/performed Overall Cognitive Status: Impaired/Different from baseline Area of Impairment: Orientation;Memory;Following commands;Safety/judgement;Awareness;Problem solving    Orientation Level: Time;Situation   Memory: Decreased recall of precautions;Decreased short-term memory Following Commands: Follows one step commands inconsistently;Follows one step commands with increased time;Follows multi-step commands inconsistently Safety/Judgement: Decreased awareness of safety;Decreased awareness of deficits   Problem Solving: Slow processing;Decreased initiation;Difficulty sequencing;Requires verbal cues;Requires tactile cues General Comments: Pt is very cooperative and pleasant however does present with severe cognition deficits. poor safety awareness with increased time to process.             Pertinent Vitals/Pain Pain Assessment:  No/denies pain            PT Goals (current goals can now be found in the care plan section) Acute Rehab PT Goals Patient Stated Goal: none stated Progress towards PT goals: Progressing toward goals    Frequency    Min 2X/week      PT Plan Discharge plan needs to be updated (pt lives home alone)       AM-PAC PT "6 Clicks" Mobility   Outcome Measure  Help needed turning from your back to your side while in a flat bed without using bedrails?: A Little Help needed moving from lying on your back to sitting on the side of a flat bed without using bedrails?: A Little Help needed moving to and from a bed to a chair (including a wheelchair)?: A Little Help needed standing up from a chair using your arms (e.g., wheelchair or bedside chair)?: A Little Help needed to walk in hospital room?: A Little Help needed climbing 3-5 steps with a railing? : A Lot 6 Click Score: 17    End of Session Equipment Utilized During Treatment: Oxygen (4 L o2) Activity Tolerance: Patient limited by fatigue;Patient tolerated treatment well Patient left: in bed;with family/visitor present;with call bell/phone within reach;with bed alarm set Nurse Communication: Mobility status PT Visit Diagnosis: Unsteadiness on feet (R26.81);Difficulty in walking, not elsewhere classified (R26.2);Muscle weakness (generalized) (M62.81)     Time: 2263-3354 PT Time Calculation (min) (ACUTE ONLY): 25 min  Charges:  $Gait Training: 8-22 mins $Therapeutic Activity: 8-22 mins                     Julaine Fusi PTA 02/21/20, 2:03 PM

## 2020-02-21 NOTE — NC FL2 (Signed)
[place] LEVEL OF CARE SCREENING TOOL     IDENTIFICATION  Patient Name: Sally Green Birthdate: 21-Feb-1946 Sex: female Admission Date (Current Location): 02/18/2020  [employer] and [place] Number:  Engineering geologist and Address:  [facility], [address] [place], [place] [address]      Provider Number: [id number]  Attending Physician Name and Address:  Lorella Nimrod, MD  Relative Name and Phone Number:       Current Level of Care: Hospital Recommended Level of Care: [place] Prior Approval Number:    Date Approved/Denied:   PASRR Number: Manual review  Discharge Plan: SNF    Current Diagnoses: Patient Active Problem List   Diagnosis Date Noted  . Hypoxia   . Shortness of breath   . Community acquired pneumonia 02/18/2020  . Acute on chronic respiratory failure with hypoxemia ([place]) 02/18/2020  . Hypokalemia 02/18/2020  . Sepsis ([place]) 02/18/2020  . Localization-related idiopathic epilepsy and epileptic syndromes with seizures of localized onset, not intractable, with status epilepticus ([place]) 01/04/2018  . Hyperlipidemia 11/24/2017  . Lung mass   . Depression (emotion) 08/17/2014  . 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 Partial seizures with loss of speech ([place]) 08/17/2014  . 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 Solitary pulmonary nodule 08/01/2013  . AVM (arteriovenous malformation) brain 07/22/2013  . Other diseases of lung, not elsewhere classified 07/22/2013  . Other forms of epilepsy and recurrent seizures without mention of intractable epilepsy 11/09/2012  . Hypersomnia with sleep apnea, unspecified 05/10/2012  . 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 Encounter for long-term (current) use of medications 05/10/2012  . 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 Diplopia 05/10/2012  . Localization-related epilepsy, intractable ([place])  05/10/2012    Orientation RESPIRATION BLADDER Height & Weight     Self, Place  O2 (Nasal Canula 2-4L.) Continent Weight: 116 lb 6.5 oz (52.8 kg) Height:  5\' 4"  (162.6 cm)  BEHAVIORAL SYMPTOMS/MOOD NEUROLOGICAL BOWEL NUTRITION STATUS   (None)  (None) Continent Diet (Regular)  AMBULATORY STATUS COMMUNICATION OF NEEDS Skin   Limited Assist Verbally Normal                       Personal Care Assistance Level of Assistance              Functional Limitations Info  Sight, Hearing, Speech Sight Info: Adequate Hearing Info: Adequate Speech Info: Adequate    SPECIAL CARE FACTORS FREQUENCY  PT (By licensed PT)     PT Frequency: 5 x week              Contractures Contractures Info: Not present    Additional Factors Info  Code Status, Allergies Code Status Info: Full code Allergies Info: Depakote (Divalproex Sodium), Aspirin, Keppra (Levetiracetam), Topamax (Topiramate), Guaifenesin           Current Medications (02/21/2020):  This is the current hospital active medication list Current Facility-Administered Medications  Medication Dose Route Frequency Provider Last Rate Last Admin  . 0.9 %  sodium chloride infusion   Intravenous Continuous Elwyn Reach, MD 125 mL/hr at 02/21/20 0304 Rate Verify at 02/21/20 0304  . acetaminophen (TYLENOL) tablet 650 mg  650 mg Oral Q6H PRN Lorella Nimrod, MD   650 mg at 02/21/20 1142  . atorvastatin (LIPITOR) tablet 40 mg  40 mg Oral QPM Lorella Nimrod, MD   40 mg at 02/20/20 1735  .  azithromycin (ZITHROMAX) 500 mg in sodium chloride 0.9 % 250 mL IVPB  500 mg Intravenous Q24H Elwyn Reach, MD   Stopped at 02/20/20 1934  . cefTRIAXone (ROCEPHIN) 2 g in sodium chloride 0.9 % 100 mL IVPB  2 g Intravenous Q24H Elwyn Reach, MD   Stopped at 02/20/20 2051  . enoxaparin (LOVENOX) injection 40 mg  40 mg Subcutaneous Q24H Gala Romney L, MD   40 mg at 02/20/20 2019  . feeding supplement (ENSURE ENLIVE / ENSURE PLUS) liquid 237  mL  237 mL Oral BID BM Lorella Nimrod, MD   237 mL at 02/21/20 1142  . fluticasone (FLONASE) 50 MCG/ACT nasal spray 1 spray  1 spray Each Nare Daily Lorella Nimrod, MD   1 spray at 02/20/20 0840  . lamoTRIgine (LAMICTAL) tablet 250 mg  250 mg Oral q AM Deatra Robinson B, RPH   250 mg at 02/21/20 0529  . lamoTRIgine (LAMICTAL) tablet 300 mg  300 mg Oral QPM Benn Moulder, RPH   300 mg at 02/20/20 1735  . multivitamin with minerals tablet 1 tablet  1 tablet Oral Daily Lorella Nimrod, MD   1 tablet at 02/21/20 1020  . phenytoin (DILANTIN) ER capsule 300 mg  300 mg Oral QHS Lorella Nimrod, MD   300 mg at 02/20/20 2019     Discharge Medications: Please see discharge summary for a list of discharge medications.  Relevant Imaging Results:  Relevant Lab Results:   Additional Information SS#: [id number]. Fully vaccinated and has had booster.  Candie Chroman, LCSW

## 2020-02-21 NOTE — Progress Notes (Signed)
PROGRESS NOTE    Sally Green  ZOX:[id number] DOB: Jul 13, 1945 DOA: 02/18/2020 PCP: Leonel Ramsay, MD   Brief Narrative: Taken from H&P. Sally Green is a 74 y.o. female with medical history significant of hypertension, hyperlipidemia, depression, lung mass, osteoporosis, AVMs and seizures who is hard of hearing presented to the ER with significant shortness of breath or cough.  Symptoms have been going on for 3 days.   There was some subjective fever and chills. Per patient there is no recent change in her chronic cough.  She was having this nonproductive cough for a long time.  History of lung cancer which is being treated few years ago.  Patient to follow-up with oncology yearly. Daughter was at bedside and according to her that they have noticed recent significant weight loss, overall declining health, generalized weakness and decreased appetite.  Patient continues to smoke.  She does not use oxygen at home at baseline.  She was found to be hypoxic requiring 4 L of oxygen to maintain saturation in low 90s.  Chest x-ray with chronic lung changes and some patchy interstitial densities more on the right for concern of pneumonia/asymmetric pulmonary edema.  Procalcitonin elevated at 0.32.  Patient was discharged yesterday with home health services.  Daughter was concerned that she cannot take care of herself and they never left the facility.  Subjective: Patient was feeling weak overall.  Daughter was concerned that she might not be able to take care of her even with the help of home health services and will try getting her to a rehab facility herself.  Assessment & Plan:   Principal Problem:   Sepsis ([place]) Active Problems:   COPD, severe ([place])   Lung mass   Hyperlipidemia   Community acquired pneumonia   Acute on chronic respiratory failure with hypoxemia (HCC)   Hypokalemia   Hypoxia   Shortness of breath  Acute hypoxic respiratory failure.  Patient does not use oxygen  at baseline.  She was admitted as sepsis with pneumonia as she met sepsis criteria with tachycardia, tachypnea, leukocytosis and concern of pneumonia on chest x-ray.  Mildly elevated procalcitonin.  Strep pneumo and Legionella are pending.  Blood cultures remain negative.  No urinary symptoms. Not sure whether she really has pneumonia as she does not has any worsening of her chronic cough or other upper respiratory symptoms. Patient has an history of right upper lobe lung cancer which was treated with radiation few years ago.  Might be a disease progression?? Recent CT chest done in July 2021 with some questionable bandlike increase in opacities in right upper lobe.  Stable left lower lobe nodule and liver cirrhosis. -Repeat CT chest with contrast. -Check BNP -Continue with ceftriaxone and Zithromax at this time. -Continue to monitor. -Continue supplemental oxygen to keep saturation above 90%.  COPD/emphysema.  No wheezing. -No need for steroid at this time. -Continue with as needed bronchodilators.  Hypokalemia/hypomagnesemia.  Potassium of 3 with magnesium of 1.6. -Replete electrolytes and monitor.  Seizure disorder.  No acute concern. -Continue home dose of Dilantin and Lamictal.  Hyperlipidemia. -Continue home statin.  Objective: Vitals:   02/21/20 0506 02/21/20 0754 02/21/20 1154 02/21/20 1212  BP: 119/60 135/62  140/62  Pulse: 86 84  83  Resp: 16   18  Temp: 98.2 F (36.8 C) 98.1 F (36.7 C)  98.1 F (36.7 C)  TempSrc: Oral Oral  Oral  SpO2: 95% 97% 92% 91%  Weight:      Height:  Intake/Output Summary (Last 24 hours) at 02/21/2020 1458 Last data filed at 02/21/2020 1000 Gross per 24 hour  Intake 1012.21 ml  Output 450 ml  Net 562.21 ml   Filed Weights   02/18/20 1325  Weight: 52.8 kg    Examination:  General exam: Frail elderly lady, appears calm and comfortable  Respiratory system: Clear bilaterally, respiratory effort normal. Cardiovascular system:  S1 & S2 heard, RRR.  Gastrointestinal system: Soft, nontender, nondistended, bowel sounds positive. Central nervous system: Alert and oriented. No focal neurological deficits.Symmetric 5 x 5 power. Extremities: No edema, no cyanosis, pulses intact and symmetrical. Psychiatry: Judgement and insight appear normal. Mood & affect appropriate.    DVT prophylaxis: Lovenox Code Status: Full Family Communication: Discussed with patient and daughter at bedside. Disposition Plan:  Status is: Inpatient  Remains inpatient appropriate because:Inpatient level of care appropriate due to severity of illness   Dispo: The patient is from: Home              Anticipated d/c is to: Home              Anticipated d/c date is: 1 day              Patient currently is medically stable.  Consultants:   Pulmonary  Procedures:  Antimicrobials:  Ceftriaxone Zithromax  Data Reviewed: I have personally reviewed following labs and imaging studies  CBC: Recent Labs  Lab 02/18/20 1332 02/19/20 0522 02/20/20 0551  WBC 15.6* 12.5* 8.7  NEUTROABS 12.5* 9.1*  --   HGB 14.6 14.0 12.8  HCT 44.7 43.4 39.4  MCV 96.3 98.2 97.5  PLT 253 218 219   Basic Metabolic Panel: Recent Labs  Lab 02/18/20 1332 02/19/20 0522 02/20/20 0551  NA 140 143 143  K 3.4* 3.0* 3.6  CL 95* 99 100  CO2 34* 31 34*  GLUCOSE 118* 86 97  BUN _0 CREATININE 0.51 0.40* 0.39*  CALCIUM 10.2 8.9 8.3*  MG  --  1.6* 1.8   GFR: Estimated Creatinine Clearance: 50.6 mL/min (A) (by C-G formula based on SCr of 0.39 mg/dL (L)). Liver Function Tests: Recent Labs  Lab 02/18/20 1332 02/19/20 0522  AST 21 21  ALT 16 14  ALKPHOS 127* 116  BILITOT 0.7 0.7  PROT 7.4 6.1*  ALBUMIN 3.2* 2.5*   No results for input(s): LIPASE, AMYLASE in the last 168 hours. No results for input(s): AMMONIA in the last 168 hours. Coagulation Profile: No results for input(s): INR, PROTIME in the last 168 hours. Cardiac Enzymes: No results for  input(s): CKTOTAL, CKMB, CKMBINDEX, TROPONINI in the last 168 hours. BNP (last 3 results) No results for input(s): PROBNP in the last 8760 hours. HbA1C: No results for input(s): HGBA1C in the last 72 hours. CBG: No results for input(s): GLUCAP in the last 168 hours. Lipid Profile: No results for input(s): CHOL, HDL, LDLCALC, TRIG, CHOLHDL, LDLDIRECT in the last 72 hours. Thyroid Function Tests: No results for input(s): TSH, T4TOTAL, FREET4, T3FREE, THYROIDAB in the last 72 hours. Anemia Panel: No results for input(s): VITAMINB12, FOLATE, FERRITIN, TIBC, IRON, RETICCTPCT in the last 72 hours. Sepsis Labs: Recent Labs  Lab 02/18/20 1332 02/18/20 1654  PROCALCITON 0.32  --   LATICACIDVEN  --  0.8    Recent Results (from the past 240 hour(s))  Resp Panel by RT-PCR (Flu A&B, Covid) Nasopharyngeal Swab     Status: None   Collection Time: 02/18/20  4:54 PM   Specimen: Nasopharyngeal Swab; Nasopharyngeal(NP)  swabs in vial transport medium  Result Value Ref Range Status   SARS Coronavirus 2 by RT PCR NEGATIVE NEGATIVE Final    Comment: (NOTE) SARS-CoV-2 target nucleic acids are NOT DETECTED.  The SARS-CoV-2 RNA is generally detectable in upper respiratory specimens during the acute phase of infection. The lowest concentration of SARS-CoV-2 viral copies this assay can detect is 138 copies/mL. A negative result does not preclude SARS-Cov-2 infection and should not be used as the sole basis for treatment or other patient management decisions. A negative result may occur with  improper specimen collection/handling, submission of specimen other than nasopharyngeal swab, presence of viral mutation(s) within the areas targeted by this assay, and inadequate number of viral copies(<138 copies/mL). A negative result must be combined with clinical observations, patient history, and epidemiological information. The expected result is Negative.  Fact Sheet for Patients:    EntrepreneurPulse.com.au  Fact Sheet for Healthcare Providers:  IncredibleEmployment.be  This test is no t yet approved or cleared by the [place] FDA and  has been authorized for detection and/or diagnosis of SARS-CoV-2 by FDA under an Emergency Use Authorization (EUA). This EUA will remain  in effect (meaning this test can be used) for the duration of the COVID-19 declaration under Section 564(b)(1) of the Act, 21 U.S.C.section 360bbb-3(b)(1), unless the authorization is terminated  or revoked sooner.       Influenza A by PCR NEGATIVE NEGATIVE Final   Influenza B by PCR NEGATIVE NEGATIVE Final    Comment: (NOTE) The Xpert Xpress SARS-CoV-2/FLU/RSV plus assay is intended as an aid in the diagnosis of influenza from Nasopharyngeal swab specimens and should not be used as a sole basis for treatment. Nasal washings and aspirates are unacceptable for Xpert Xpress SARS-CoV-2/FLU/RSV testing.  Fact Sheet for Patients: EntrepreneurPulse.com.au  Fact Sheet for Healthcare Providers: IncredibleEmployment.be  This test is not yet approved or cleared by the [place] FDA and has been authorized for detection and/or diagnosis of SARS-CoV-2 by FDA under an Emergency Use Authorization (EUA). This EUA will remain in effect (meaning this test can be used) for the duration of the COVID-19 declaration under Section 564(b)(1) of the Act, 21 U.S.C. section 360bbb-3(b)(1), unless the authorization is terminated or revoked.  Performed at [facility], [address] [place], [place] [address]   Culture, blood (single)     Status: None (Preliminary result)   Collection Time: 02/18/20  5:03 PM   Specimen: BLOOD  Result Value Ref Range Status   Specimen Description BLOOD LEFT ARM  Final   Special Requests   Final    BOTTLES DRAWN AEROBIC AND ANAEROBIC Blood Culture results may not be optimal due to an  excessive volume of blood received in culture bottles   Culture  Setup Time PENDING  Incomplete   Culture   Final    NO GROWTH 3 DAYS Performed at [facility], [address], [place], [place] [address]    Report Status PENDING  Incomplete  Culture, blood (routine x 2) Call MD if unable to obtain prior to antibiotics being given     Status: None (Preliminary result)   Collection Time: 02/18/20  5:29 PM   Specimen: BLOOD  Result Value Ref Range Status   Specimen Description BLOOD BLOOD RIGHT ARM  Final   Special Requests   Final    BOTTLES DRAWN AEROBIC AND ANAEROBIC Blood Culture results may not be optimal due to an excessive volume of blood received in culture bottles   Culture  Final    NO GROWTH 3 DAYS Performed at [facility], [address], [place], [place] [address]    Report Status PENDING  Incomplete     Radiology Studies: No results found.  Scheduled Meds: . atorvastatin  40 mg Oral QPM  . enoxaparin (LOVENOX) injection  40 mg Subcutaneous Q24H  . feeding supplement  237 mL Oral BID BM  . fluticasone  1 spray Each Nare Daily  . lamoTRIgine  250 mg Oral q AM  . lamoTRIgine  300 mg Oral QPM  . multivitamin with minerals  1 tablet Oral Daily  . phenytoin  300 mg Oral QHS   Continuous Infusions: . sodium chloride 125 mL/hr at 02/21/20 0304  . azithromycin Stopped (02/20/20 1934)  . cefTRIAXone (ROCEPHIN)  IV Stopped (02/20/20 2051)     LOS: 3 days   Time spent: 25 minutes.  Lorella Nimrod, MD [facility]  If 7PM-7AM, please contact night-coverage [email or web address]  02/21/2020, 2:58 PM   This record has been created using Systems analyst. Errors have been sought and corrected,but may not always be located. Such creation errors do not reflect on the standard of care.

## 2020-02-21 NOTE — Care Management Important Message (Signed)
Important Message  Patient Details  Name: FARRAN AMSDEN MRN: [id number] Date of Birth: July 16, 1945   Medicare Important Message Given:  Yes     Dannette Barbara 02/21/2020, 10:48 AM

## 2020-02-22 LAB — CULTURE, BLOOD (SINGLE)

## 2020-02-23 LAB — CULTURE, BLOOD (ROUTINE X 2): Culture: NO GROWTH

## 2020-02-24 LAB — CULTURE, BLOOD (SINGLE): Culture: NO GROWTH

## 2020-04-02 ENCOUNTER — Other Ambulatory Visit: Payer: Self-pay | Admitting: *Deleted

## 2020-04-02 DIAGNOSIS — G40001 Localization-related (focal) (partial) idiopathic epilepsy and epileptic syndromes with seizures of localized onset, not intractable, with status epilepticus: Secondary | ICD-10-CM

## 2020-04-02 MED ORDER — DILANTIN 100 MG PO CAPS: 300.0000 mg | ORAL_CAPSULE | Freq: Every day | ORAL | 3 refills | Status: AC

## 2020-06-13 ENCOUNTER — Other Ambulatory Visit: Payer: Self-pay | Admitting: Neurology

## 2020-06-13 DIAGNOSIS — G40001 Localization-related (focal) (partial) idiopathic epilepsy and epileptic syndromes with seizures of localized onset, not intractable, with status epilepticus: Secondary | ICD-10-CM

## 2020-10-08 ENCOUNTER — Encounter: Payer: Self-pay | Admitting: Adult Health

## 2020-10-08 ENCOUNTER — Ambulatory Visit (INDEPENDENT_AMBULATORY_CARE_PROVIDER_SITE_OTHER): Payer: Medicare Other | Admitting: Adult Health

## 2020-10-08 ENCOUNTER — Other Ambulatory Visit: Payer: Self-pay

## 2020-10-08 VITALS — BP 170/68 | HR 99 | Ht 64.0 in | Wt 110.0 lb

## 2020-10-08 DIAGNOSIS — G40001 Localization-related (focal) (partial) idiopathic epilepsy and epileptic syndromes with seizures of localized onset, not intractable, with status epilepticus: Secondary | ICD-10-CM

## 2020-10-08 NOTE — Patient Instructions (Signed)
Continue Lamictal and Dilantin  Blood work today If you have any seizure events please let us know.

## 2020-10-08 NOTE — Progress Notes (Signed)
PATIENT: Sally Green DOB: 08-07-1945  REASON FOR VISIT: follow up HISTORY FROM: patient Primary neurologist: Dr. Brett Fairy  HISTORY OF PRESENT ILLNESS: Today 10/08/20:  Sally Green is a 75 year old female with a history of seizures.  She returns today for follow-up.  She continues on Dilantin 300 mg at bedtime.  She continues on Lamictal 250 mg in the morning and 300 mg at bedtime.  She states that she has at least 1-2 events a month.  Sometimes it spaced out by 2 or 3 weeks other times it may be 9 days.  Her seizures are still present the same-starts out as a tingling sensation in the center of the body and then she cannot speak for approximately 1 to 2 minutes afterwards.  10/04/19: Sally Green is a 75 year old female with a history of seizures.  She returns today for follow-up.  She reports that since we increased Dilantin she is only had 2 events.  Reports that her seizures have remained the same.  She reports a tingling sensation down the center of the body and trouble speaking.  Reports that these events only last approximately 1 minute.  She denies any new symptoms.  She returns today for an evaluation.  HISTORY 03/08/19:   Sally Green is a 75 year old female with a history of seizures.  She returns today for follow-up.  She states that since her last visit on November 17 she feels that her seizure frequency has increased.  She had a seizure December 5, 6 and 2 on the seventh.  Her seizures have not changed.  She continues to have a tingling sensation down the center of the body followed by trouble speaking.  The events only last 30 seconds to 1 minute.  Her Dilantin level at the last visit was low.  She denies missing any medication.  She returns today for an evaluation.  REVIEW OF SYSTEMS: Out of a complete 14 system review of symptoms, the patient complains only of the following symptoms, and all other reviewed systems are negative.  See HPI  ALLERGIES: Allergies  Allergen Reactions    Depakote [Divalproex Sodium]     Nervous    Aspirin     Instructed to never take ASA   Keppra [Levetiracetam]     unknown   Topamax [Topiramate]     unknown   Guaifenesin Palpitations    HOME MEDICATIONS: Outpatient Medications Prior to Visit  Medication Sig Dispense Refill   acetaminophen (TYLENOL) 500 MG tablet Take 500 mg by mouth daily as needed for moderate pain or headache.     albuterol (PROVENTIL HFA;VENTOLIN HFA) 108 (90 Base) MCG/ACT inhaler Inhale 2 puffs into the lungs every 6 (six) hours as needed for wheezing or shortness of breath.     atorvastatin (LIPITOR) 40 MG tablet Take 40 mg by mouth every evening.   1   DILANTIN 100 MG ER capsule Take 3 capsules (300 mg total) by mouth at bedtime. 270 capsule 3   feeding supplement (ENSURE ENLIVE / ENSURE PLUS) LIQD Take 237 mLs by mouth 2 (two) times daily between meals. 237 mL 12   feeding supplement (ENSURE ENLIVE / ENSURE PLUS) LIQD Take 237 mLs by mouth 2 (two) times daily between meals. 237 mL 12   fluticasone (FLONASE) 50 MCG/ACT nasal spray Place 1 spray into the nose 2 (two) times daily.     lamoTRIgine (LAMICTAL) 100 MG tablet TAKE 2.5 TABLETS BY MOUTH EVERY MORNING AND TAKE 3 TABLETS EVERY DAY IN AFTERNOON 495  tablet 1   Multiple Vitamin (MULTIVITAMIN WITH MINERALS) TABS tablet Take 1 tablet by mouth daily.     No facility-administered medications prior to visit.    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  Sees dr Tommi Rumps at [facility]  Oct 2014.     Pneumonia 02/18/2020   hospitalized then d/c to rehab facility on 02/20/21. discharged from rehab on 03/07/20.   Seizures ([place])    LAST 9/18    PAST SURGICAL HISTORY: Past Surgical History:  Procedure Laterality Date   ABDOMINAL HYSTERECTOMY     BREAST BIOPSY Left 2004   neg   CATARACT EXTRACTION W/PHACO Right 01/13/2017   Procedure: CATARACT EXTRACTION PHACO AND INTRAOCULAR LENS PLACEMENT (IOC);  Surgeon: Birder Robson, MD;  Location: [facility] ORS;  Service: Ophthalmology;  Laterality: Right;  [place]  00:49.3 AP%  14.7 CDE   7.28 Fluid pack lot # [id number]   CATARACT EXTRACTION W/PHACO Left 02/03/2017   Procedure: CATARACT EXTRACTION PHACO AND INTRAOCULAR LENS PLACEMENT ([place]);  Surgeon: Birder Robson, MD;  Location: [facility] ORS;  Service: Ophthalmology;  Laterality: Left;  [place] 00:41 AP% 14.3 CDE 6.01 Fluid pack lot # [id number]   COLONOSCOPY WITH PROPOFOL N/A 04/10/2017   Procedure: COLONOSCOPY WITH PROPOFOL;  Surgeon: Manya Silvas, MD;  Location[facility] ENDOSCOPY;  Service: Endoscopy;  Laterality: N/A;   dental implants     ELECTROMAGNETIC NAVIGATION BROCHOSCOPY N/A 03/07/2015   Procedure: ELECTROMAGNETIC NAVIGATION BRONCHOSCOPY;  Surgeon: Flora Lipps, MD;  Location: [facility] ORS;  Service: Cardiopulmonary;  Laterality: N/A;   EYE SURGERY     oophrectomy     SHOULDER SURGERY Left 2007   VAGINAL DELIVERY     3 children    FAMILY HISTORY: Family History  Problem Relation Age of Onset   Lymphoma Mother    Alzheimer's disease Father    Aneurysm Father    Headache Father    Cancer Other    Aneurysm Paternal Uncle    Headache Sister    Cancer Sister        lung   Cancer Maternal Aunt    Breast cancer Maternal Aunt    Cancer Paternal Aunt    Leukemia Maternal Uncle     SOCIAL HISTORY: Social History   Socioeconomic History   Marital status: Widowed     Spouse name: [name]   Number of children: 3   Years of education: 12   Highest education level: Not on file  Occupational History   Occupation: retired  Tobacco Use   Smoking status: Former    Packs/day: 1.25    Years: 50.00    Pack years: 62.50    Types: Cigarettes    Quit date: 06/24/2013    Years since quitting: 7.2   Smokeless tobacco: Never  Vaping Use   Vaping Use: Never used  Substance and Sexual Activity   Alcohol use: No   Drug use: No   Sexual activity: Not on file  Other Topics Concern   Not on file  Social History Narrative   Patient is widowed and lives alone    Patient has three children.   Patient is retired.   Patient has a high school education.   Patient is right-handed.   Consumes caffeine rarely.   She drinks Ensure TID with meals.   Social Determinants of Health   Financial Resource Strain: Not on file  Food Insecurity: Not on file  Transportation Needs: Not on file  Physical Activity: Not on file  Stress: Not on file  Social Connections: Not on file  Intimate Partner Violence: Not on file      PHYSICAL EXAM  Vitals:   10/08/20 1050  BP: (!) 170/68  Pulse: 99  Weight: 110 lb (49.9 kg)  Height: 5\' 4"  (1.626 m)   Body mass index is 18.88 kg/m.  Generalized: Well developed, in no acute distress   Neurological examination  Mentation: Alert oriented to time, place, history taking. Follows all commands speech and language fluent Cranial nerve II-XII: Pupils were equal round reactive to light. Extraocular movements were full, visual field were full on confrontational test.  Head turning and shoulder shrug  were normal and symmetric. Motor: The motor testing reveals 5 over 5 strength of all 4 extremities. Good symmetric motor tone is noted throughout.  Sensory: Sensory testing is intact to soft touch on all 4 extremities. No evidence of extinction is noted.  Coordination: Cerebellar testing reveals good finger-nose-finger and heel-to-shin  bilaterally.  Gait and station: Gait is normal.   Reflexes: Deep tendon reflexes are symmetric and normal bilaterally.   DIAGNOSTIC DATA (LABS, IMAGING, TESTING) - I reviewed patient records, labs, notes, testing and imaging myself where available.  Lab Results  Component Value Date   WBC 8.7 02/20/2020   HGB 12.8 02/20/2020   HCT 39.4 02/20/2020   MCV 97.5 02/20/2020   PLT 225 02/20/2020      Component Value Date/Time   NA 143 02/20/2020 0551   NA 146 (H) 10/04/2019 1052   NA 143 06/09/2013 1013   K 3.6 02/20/2020 0551   K 3.5 06/09/2013 1013   CL 100 02/20/2020 0551   CL 104 06/09/2013 1013   CO2 34 (H) 02/20/2020 0551   CO2 32 06/09/2013 1013   GLUCOSE 97 02/20/2020 0551   GLUCOSE 72 06/09/2013 1013   BUN 8 02/20/2020 0551   BUN 14 10/04/2019 1052   BUN 12 06/09/2013 1013   CREATININE 0.39 (L) 02/20/2020 0551   CREATININE 0.72 06/09/2013 1013   CALCIUM 8.3 (L) 02/20/2020 0551   CALCIUM 8.8 06/09/2013 1013   PROT 6.1 (L) 02/19/2020 0522   PROT 6.5 10/04/2019 1052   PROT 6.8 06/09/2013 1013   ALBUMIN 2.5 (L) 02/19/2020 0522   ALBUMIN 4.0 10/04/2019 1052   ALBUMIN 3.3 (L) 06/09/2013 1013   AST 21 02/19/2020 0522   AST 17 06/09/2013 1013   ALT 14 02/19/2020 0522   ALT 16 06/09/2013 1013   ALKPHOS 116 02/19/2020 0522   ALKPHOS 109 06/09/2013 1013   BILITOT 0.7 02/19/2020 0522   BILITOT <0.2 10/04/2019 1052   BILITOT 0.3 06/09/2013 1013   GFRNONAA >60 02/20/2020 0551   GFRNONAA >60 06/09/2013 1013   GFRAA 99 10/04/2019 1052   GFRAA >60 06/09/2013 1013      ASSESSMENT AND PLAN 75 y.o. 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 here with  Seizures  Continue Dilantin 300 mg at bedtime  Continue Lamictal 250 mg in the morning and 300 mg at bedtime Blood work today Advised if she has any seizure event she should let us know Follow-up in 1 year or sooner if needed   I spent 20 minutes of face-to-face and non-face-to-face time with patient.  This included previsit chart review, lab review, study review, order entry, electronic health record documentation, patient education.  Ward Givens, MSN, NP-C 10/08/2020, 11:16 AM [facility] [address] [place], [place] [address] [phone]

## 2020-10-09 LAB — COMPREHENSIVE METABOLIC PANEL
ALT: 18 IU/L (ref 0–32)
AST: 21 IU/L (ref 0–40)
Albumin/Globulin Ratio: 1.9 (ref 1.2–2.2)
Albumin: 4.4 g/dL (ref 3.7–4.7)
Alkaline Phosphatase: 117 IU/L (ref 44–121)
BUN/Creatinine Ratio: 27 (ref 12–28)
BUN: 17 mg/dL (ref 8–27)
Bilirubin Total: <0.2 mg/dL (ref 0.0–1.2)
CO2: 28 mmol/L (ref 20–29)
Calcium: 10 mg/dL (ref 8.7–10.3)
Chloride: 97 mmol/L (ref 96–106)
Creatinine, Ser: 0.63 mg/dL (ref 0.57–1.00)
Globulin, Total: 2.3 g/dL (ref 1.5–4.5)
Glucose: 75 mg/dL (ref 65–99)
Potassium: 4.3 mmol/L (ref 3.5–5.2)
Sodium: 143 mmol/L (ref 134–144)
Total Protein: 6.7 g/dL (ref 6.0–8.5)
eGFR: 92 mL/min/{1.73_m2} (ref >59)

## 2020-10-09 LAB — CBC WITH DIFFERENTIAL/PLATELET
Basophils Absolute: 0 10*3/uL (ref 0.0–0.2)
Basos: 1 %
EOS (ABSOLUTE): 0.1 10*3/uL (ref 0.0–0.4)
Eos: 1 %
Hematocrit: 45.9 % (ref 34.0–46.6)
Hemoglobin: 15.7 g/dL (ref 11.1–15.9)
Immature Grans (Abs): 0 10*3/uL (ref 0.0–0.1)
Immature Granulocytes: 0 %
Lymphocytes Absolute: 2.7 10*3/uL (ref 0.7–3.1)
Lymphs: 41 %
MCH: 32.9 pg (ref 26.6–33.0)
MCHC: 34.2 g/dL (ref 31.5–35.7)
MCV: 96 fL (ref 79–97)
Monocytes Absolute: 1 10*3/uL — ABNORMAL HIGH (ref 0.1–0.9)
Monocytes: 15 %
Neutrophils Absolute: 2.8 10*3/uL (ref 1.4–7.0)
Neutrophils: 42 %
Platelets: 210 10*3/uL (ref 150–450)
RBC: 4.77 x10E6/uL (ref 3.77–5.28)
RDW: 13.3 % (ref 11.7–15.4)
WBC: 6.7 10*3/uL (ref 3.4–10.8)

## 2020-10-09 LAB — LAMOTRIGINE LEVEL: Lamotrigine Lvl: 3.8 ug/mL (ref 2.0–20.0)

## 2020-10-09 LAB — PHENYTOIN LEVEL, TOTAL: Phenytoin (Dilantin), Serum: 10.3 ug/mL (ref 10.0–20.0)

## 2020-10-12 ENCOUNTER — Other Ambulatory Visit: Payer: Self-pay

## 2020-10-12 ENCOUNTER — Ambulatory Visit
Admission: RE | Admit: 2020-10-12 | Discharge: 2020-10-12 | Disposition: A | Discharge status: Home / Self Care | Payer: Medicare Other | Source: Ambulatory Visit | Attending: Radiation Oncology | Admitting: Radiation Oncology

## 2020-10-12 DIAGNOSIS — C3411 Malignant neoplasm of upper lobe, right bronchus or lung: Secondary | ICD-10-CM | POA: Diagnosis present

## 2020-10-12 MED ORDER — IOHEXOL 300 MG/ML  SOLN
75.0000 mL | Freq: Once | INTRAMUSCULAR | Status: AC | PRN
  Administered 2020-10-12: 75 mL via INTRAVENOUS

## 2020-10-18 ENCOUNTER — Telehealth: Payer: Self-pay | Admitting: *Deleted

## 2020-10-18 NOTE — Telephone Encounter (Signed)
LMVM for pt to return call for lab results.  

## 2020-10-18 NOTE — Telephone Encounter (Signed)
-----   Message from Ward Givens, NP sent at 10/18/2020 11:24 AM EDT ----- Results are relatively unremarkable.  Please ask the patient if she has had any seizure events

## 2020-10-19 ENCOUNTER — Other Ambulatory Visit: Payer: Self-pay

## 2020-10-19 ENCOUNTER — Ambulatory Visit
Admission: RE | Admit: 2020-10-19 | Discharge: 2020-10-19 | Disposition: A | Discharge status: Home / Self Care | Payer: Medicare Other | Source: Ambulatory Visit | Attending: Radiation Oncology | Admitting: Radiation Oncology

## 2020-10-19 VITALS — BP 136/78 | HR 83 | Temp 95.0°F | Wt 110.0 lb

## 2020-10-19 DIAGNOSIS — Z85118 Personal history of other malignant neoplasm of bronchus and lung: Secondary | ICD-10-CM | POA: Diagnosis present

## 2020-10-19 DIAGNOSIS — C3411 Malignant neoplasm of upper lobe, right bronchus or lung: Secondary | ICD-10-CM

## 2020-10-19 DIAGNOSIS — Z79899 Other long term (current) drug therapy: Secondary | ICD-10-CM | POA: Diagnosis not present

## 2020-10-19 DIAGNOSIS — Z923 Personal history of irradiation: Secondary | ICD-10-CM | POA: Diagnosis not present

## 2020-10-19 NOTE — Progress Notes (Signed)
Radiation Oncology Follow up Note  Name: Sally Green   Date:   10/19/2020 MRN:  [id number] DOB: 1945/08/25    This 75 y.o. 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  REFERRING PROVIDER: Leonel Ramsay, MD  HPI: Patient is a 75 year old female now out 5-1/2 years having completed SBRT to her right upper lobe for stage I squamous cell carcinoma.  Seen today in routine follow-up she is doing well.  She specifically denies cough hemoptysis or chest tightness..  Back in November she was hospitalized and [facility].  She was hypoxic on 4 L of oxygen.  Has completely cleared breathing is markedly improved she is starting to put on weight.  She had a recent CT scan of her chest showing interval resolution of diffuse irregular tree-in-bud nodularity recently seen in both lungs compatible with infectious process.  Right upper lobe and left lower lobe pulmonary nodules are stable.  No progressive findings were seen.  COMPLICATIONS OF TREATMENT: none  FOLLOW UP COMPLIANCE: keeps appointments   PHYSICAL EXAM:  BP 136/78   Pulse 83   Temp (!) 95 F (35 C) (Tympanic)   Wt 110 lb (49.9 kg)   BMI 18.88 kg/m  Well-developed well-nourished patient in NAD. HEENT reveals PERLA, EOMI, discs not visualized.  Oral cavity is clear. No oral mucosal lesions are identified. Neck is clear without evidence of cervical or supraclavicular adenopathy. Lungs are clear to A&P. Cardiac examination is essentially unremarkable with regular rate and rhythm without murmur rub or thrill. Abdomen is benign with no organomegaly or masses noted. Motor sensory and DTR levels are equal and symmetric in the upper and lower extremities. Cranial nerves II through XII are grossly intact. Proprioception is intact. No peripheral adenopathy or edema is identified. No motor or sensory levels are noted. Crude visual  fields are within normal range.  RADIOLOGY RESULTS: CT scan reviewed compatible with above-stated findings  PLAN: Present time patient is doing well no evidence of disease 5 and half years out we will keep another eye on her with a repeat CT scan and follow-up in 1 year.  Patient knows to call with any concerns.  I would like to take this opportunity to thank you for allowing me to participate in the care of your patient.Noreene Filbert, MD

## 2020-10-22 NOTE — Telephone Encounter (Signed)
LMVM for pt to return call for lab results.  

## 2020-10-23 NOTE — Telephone Encounter (Signed)
Pt returned call. Please call back when available. 

## 2020-10-23 NOTE — Telephone Encounter (Signed)
Spoke with patient and advised of lab results being relatively unremarkable. Pt confirmed she did have one seizure (usual presentation) on 10/13/20. Pt also added that she had her annual visit with Dr Tommi Rumps at [facility] on 10/16/20 and was advised all is stable. I advised a message would be sent to the doctor as Jinny Blossom NP is not currently in the office. We will call back if there are any concerns or further recommendations. Pt was also advised that if any events occur that are concerning or different to call 911 for immediate evaluation. She verbalized understanding and appreciation for the call.

## 2020-10-24 ENCOUNTER — Telehealth: Payer: Self-pay | Admitting: *Deleted

## 2020-10-24 NOTE — Telephone Encounter (Signed)
I relayed to pt the lab results per MM/NP which :   Results are relatively unremarkable.  Pt has not had any seizure events since she was seen in the office.

## 2020-11-07 ENCOUNTER — Other Ambulatory Visit: Payer: Self-pay | Admitting: Infectious Diseases

## 2020-11-07 DIAGNOSIS — Z1231 Encounter for screening mammogram for malignant neoplasm of breast: Secondary | ICD-10-CM

## 2020-11-15 ENCOUNTER — Telehealth: Payer: Self-pay | Admitting: Adult Health

## 2020-11-29 NOTE — Telephone Encounter (Signed)
Pt has passed away.

## 2020-11-29 NOTE — Telephone Encounter (Signed)
noted 

## 2020-11-29 DEATH — deceased

## 2020-12-11 NOTE — Telephone Encounter (Signed)
ERROR

## 2021-04-08 ENCOUNTER — Ambulatory Visit: Payer: Medicare Other | Admitting: Neurology

## 2021-10-18 ENCOUNTER — Ambulatory Visit: Attending: Radiation Oncology

## 2021-10-21 ENCOUNTER — Ambulatory Visit: Admitting: Radiation Oncology

## 2021-12-31 ENCOUNTER — Ambulatory Visit: Attending: Radiation Oncology | Admitting: Radiation Oncology
